# Patient Record
Sex: Female | Born: 1985 | Race: White | Hispanic: Yes | Marital: Married | Smoking: Never smoker
Health system: Southern US, Community
[De-identification: ages and names within clinical notes are randomized; demographics above are authoritative.]

## PROBLEM LIST (undated history)

## (undated) ENCOUNTER — Inpatient Hospital Stay (HOSPITAL_COMMUNITY): Payer: Self-pay

## (undated) DIAGNOSIS — O1490 Unspecified pre-eclampsia, unspecified trimester: Secondary | ICD-10-CM

## (undated) DIAGNOSIS — I8390 Asymptomatic varicose veins of unspecified lower extremity: Secondary | ICD-10-CM

---
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
## 2006-01-04 ENCOUNTER — Emergency Department (HOSPITAL_COMMUNITY): Admission: EM | Admit: 2006-01-04 | Discharge: 2006-01-05 | Payer: Self-pay

## 2006-08-09 ENCOUNTER — Ambulatory Visit (HOSPITAL_COMMUNITY): Admission: RE | Admit: 2006-08-09 | Discharge: 2006-08-09 | Payer: Self-pay | Admitting: Family Medicine

## 2006-09-03 ENCOUNTER — Inpatient Hospital Stay (HOSPITAL_COMMUNITY): Admission: AD | Admit: 2006-09-03 | Discharge: 2006-09-03 | Payer: Self-pay | Admitting: Obstetrics & Gynecology

## 2006-09-03 ENCOUNTER — Ambulatory Visit: Payer: Self-pay | Admitting: Obstetrics and Gynecology

## 2006-09-10 ENCOUNTER — Inpatient Hospital Stay (HOSPITAL_COMMUNITY): Admission: AD | Admit: 2006-09-10 | Discharge: 2006-09-11 | Payer: Self-pay | Admitting: Obstetrics & Gynecology

## 2006-09-14 ENCOUNTER — Inpatient Hospital Stay (HOSPITAL_COMMUNITY): Admission: AD | Admit: 2006-09-14 | Discharge: 2006-09-15 | Payer: Self-pay | Admitting: Family Medicine

## 2006-09-14 ENCOUNTER — Ambulatory Visit: Payer: Self-pay | Admitting: Obstetrics and Gynecology

## 2007-06-23 ENCOUNTER — Ambulatory Visit: Payer: Self-pay | Admitting: Obstetrics and Gynecology

## 2007-06-23 ENCOUNTER — Inpatient Hospital Stay (HOSPITAL_COMMUNITY): Admission: AD | Admit: 2007-06-23 | Discharge: 2007-06-24 | Payer: Self-pay | Admitting: Obstetrics and Gynecology

## 2007-07-11 ENCOUNTER — Ambulatory Visit: Payer: Self-pay | Admitting: Obstetrics & Gynecology

## 2007-07-11 ENCOUNTER — Encounter: Payer: Self-pay | Admitting: Obstetrics & Gynecology

## 2007-07-18 ENCOUNTER — Ambulatory Visit: Payer: Self-pay | Admitting: Obstetrics & Gynecology

## 2007-07-25 ENCOUNTER — Ambulatory Visit: Payer: Self-pay | Admitting: Obstetrics & Gynecology

## 2007-07-26 ENCOUNTER — Ambulatory Visit (HOSPITAL_COMMUNITY): Admission: RE | Admit: 2007-07-26 | Discharge: 2007-07-26 | Payer: Self-pay | Admitting: Family Medicine

## 2007-08-04 ENCOUNTER — Ambulatory Visit: Payer: Self-pay | Admitting: *Deleted

## 2007-08-04 ENCOUNTER — Inpatient Hospital Stay (HOSPITAL_COMMUNITY): Admission: AD | Admit: 2007-08-04 | Discharge: 2007-08-04 | Payer: Self-pay | Admitting: Family Medicine

## 2007-08-08 ENCOUNTER — Ambulatory Visit: Payer: Self-pay | Admitting: Obstetrics & Gynecology

## 2007-08-22 ENCOUNTER — Ambulatory Visit: Payer: Self-pay | Admitting: Obstetrics & Gynecology

## 2007-08-29 ENCOUNTER — Ambulatory Visit: Payer: Self-pay | Admitting: Obstetrics & Gynecology

## 2007-09-05 ENCOUNTER — Inpatient Hospital Stay (HOSPITAL_COMMUNITY): Admission: AD | Admit: 2007-09-05 | Discharge: 2007-09-05 | Payer: Self-pay | Admitting: Obstetrics and Gynecology

## 2007-09-05 ENCOUNTER — Ambulatory Visit: Payer: Self-pay | Admitting: Obstetrics & Gynecology

## 2007-09-10 ENCOUNTER — Ambulatory Visit: Payer: Self-pay | Admitting: Obstetrics and Gynecology

## 2007-09-10 ENCOUNTER — Inpatient Hospital Stay (HOSPITAL_COMMUNITY): Admission: AD | Admit: 2007-09-10 | Discharge: 2007-09-13 | Payer: Self-pay | Admitting: Obstetrics & Gynecology

## 2009-08-03 ENCOUNTER — Emergency Department (HOSPITAL_COMMUNITY): Admission: EM | Admit: 2009-08-03 | Discharge: 2009-08-03 | Payer: Self-pay | Admitting: Emergency Medicine

## 2010-08-22 LAB — POCT I-STAT, CHEM 8
Calcium, Ion: 1.22 mmol/L (ref 1.12–1.32)
Chloride: 105 mEq/L (ref 96–112)
Chloride: 105 mEq/L (ref 96–112)
Creatinine, Ser: 0.5 mg/dL (ref 0.4–1.2)
Creatinine, Ser: 0.7 mg/dL (ref 0.4–1.2)
Glucose, Bld: 76 mg/dL (ref 70–99)
HCT: 39 % (ref 36.0–46.0)
Hemoglobin: 13.3 g/dL (ref 12.0–15.0)
Potassium: 3.9 mEq/L (ref 3.5–5.1)
TCO2: 28 mmol/L (ref 0–100)
TCO2: 29 mmol/L (ref 0–100)

## 2010-08-22 LAB — URINALYSIS, ROUTINE W REFLEX MICROSCOPIC
Bilirubin Urine: NEGATIVE
Hgb urine dipstick: NEGATIVE
Ketones, ur: NEGATIVE mg/dL
Nitrite: NEGATIVE
Protein, ur: NEGATIVE mg/dL

## 2010-10-12 NOTE — Discharge Summary (Signed)
Debra Newton, Debra Newton    ACCOUNT NO.:  [id number]   MEDICAL RECORD NO.:  [id number]          PATIENT TYPE:  INP   LOCATION:  9140                          [facility]   PHYSICIAN:  Phil D. Okey Dupre, M.D.     DATE OF BIRTH:  02-Apr-1986   DATE OF ADMISSION:  09/10/2007  DATE OF DISCHARGE:  09/13/2007                               DISCHARGE SUMMARY   DISCHARGE DIAGNOSES:  1. Spontaneous vaginal delivery at term.  2. Preeclampsia.   DISCHARGE MEDICATIONS:  1. Ibuprofen 600 mg p.o. q.6 hours p.r.n. pain.  2. Colace 100 mg p.o. daily p.r.n. constipation.  3. Prenatal vitamins 1 tab p.o. daily.   BRIEF HOSPITAL COURSE:  This is a 25 year old G3, P3-0-0-3 who had a  term delivery at 40 weeks and 5 days' gestation.  She had elevated blood  pressures during this admission as well as headache, and given her  history of preeclampsia with her last pregnancy, she was placed on  magnesium sulfate IV during labor and for 24 hours postpartum.  Preeclampsia labs were drawn and were within normal limits.  At the time  of discharge, the patient has blood pressure of 122/77, previous blood  pressure was 115/62.  She has been off magnesium since September 12, 2007,  and is discharged home on September 13, 2007.  At the time of discharge, she  has normal deep tendon reflexes.  She is complaining of a mild headache,  but has been Tylenol for this.  She delivered a 7-pound 6-ounce female  infant after spontaneous rupture of membranes and a normal delivery with  no complications and an intact perineum.  The patient is GBS positive,  ABO type O, Rh positive,  AB negative,  Rubella immune,  HBS antigen  negative,  RPR nonreactive,  GC negative,  Chlamydia negative,  HIV  nonreactive.  She has elected to breast-feed the infant and is doing  well at this time.  She has opted for Depo-Provera injection prior to  discharge and will have an IUD placed at her 6-week followup.  The  patient was discharged to home  in stable medical condition and is given  routine postpartum instructions and instructed to followup at the  [facility] in 6 weeks.  She was also instructed  to return to clinic if she has a persistent headache or changes in her  vision.      Romero Belling, MD      Phil D. Okey Dupre, M.D.  Electronically Signed    MO/MEDQ  D:  09/13/2007  T:  09/13/2007  Job:  [id number]

## 2010-10-15 NOTE — H&P (Signed)
Debra Newton, Debra Newton            ACCOUNT NO.:  [id number]   MEDICAL RECORD NO.:  [id number]          PATIENT TYPE:  INP   LOCATION:  9160                          [facility]   PHYSICIAN:  Roseanna Rainbow, M.D.DATE OF BIRTH:  1986/05/08   DATE OF ADMISSION:  08/16/2004  DATE OF DISCHARGE:                                HISTORY & PHYSICAL   CHIEF COMPLAINT:  The patient is an 25 year old, para 0, with an estimated  date of confinement of August 15, 2004 with an intrauterine pregnancy at 1  and 1/7ths weeks, with elevated blood pressures.  The patient presented to  the office today for a routine prenatal visit.  Blood pressures were  140s/90s.  She denied any concomitant symptoms.   ANTEPARTUM PROBLEMS AND RISKS:  1.  Adolescent.  2.  Ultrasound on April 21, 2004 revealed anterior placenta, normal      amniotic fluid volume.  Estimated fetal weight percentile - 46      percentile for 23 weeks and 3 days.   PRENATAL SCREENS:  Antibody screen negative.  Blood type O positive.  Chlamydia negative.  One hour GCT 91.  GBS negative.  GC negative.  Hepatitis B surface antigen negative.  Hemoglobin 12.7.  Pap smear within  normal limits.  Platelets 198,000.  Urine culture and sensitivity negative.   Fetal heart tracing reassuring.  Tocodynamometer revealed irregular uterine  contractions.   PAST OBSTETRIC AND GYNECOLOGIC HISTORY:  She denies.   PAST MEDICAL HISTORY:  She denies.   PAST SURGICAL HISTORY:  She denies.   SOCIAL HISTORY:  She denies any tobacco, ethanol, or substance abuse.   ALLERGIES:  No known drug allergies.   MEDICATIONS:  1.  Zantac.  2.  Prenatal vitamins.   PHYSICAL EXAMINATION:  VITAL SIGNS:  Temperature 98, pulse 96, respirations  20, blood pressure 139/82.  GENERAL:  Well-developed, well-nourished, in no apparent distress.  ABDOMEN:  Nontender.  Presentation cephalic by Leopold's.  PELVIC:  Sterile vaginal exam revealed the cervix is  posterior,  approximately 50%, fingertip to 1, with the vertex at a -2 station.   ASSESSMENT:  Primigravida at 40 plus weeks with rule out pregnancy-induced  hypertension.  Fetal heart tracing consistent with fetal well being.   PLAN:  1.  Admission.  2.  Further ripening with Cytotec.  3.  PIH panel.      LAJ/MEDQ  D:  08/16/2004  T:  08/16/2004  Job:  [id number]

## 2010-10-15 NOTE — Discharge Summary (Signed)
Debra Newton, Debra Newton                        ACCOUNT NO.:  [id number]   MEDICAL RECORD NO.:  [id number]                   PATIENT TYPE:  INP   LOCATION:  9311                                 [facility]   PHYSICIAN:  Charles A. Clearance Coots, M.D.             DATE OF BIRTH:  [birth year]   DATE OF ADMISSION:  02/14/2004  DATE OF DISCHARGE:                                 DISCHARGE SUMMARY   ADMITTING DIAGNOSES:  1.  Approximately [redacted] weeks gestation.  2.  Hyperemesis.   DISCHARGE DIAGNOSES:  1.  Approximately [redacted] weeks gestation.  2.  Hyperemesis.  3.  Improved after intravenous hydration and supportive management.      Discharged home in good condition undelivered at [redacted] weeks gestation.   REASON FOR ADMISSION:  A 25 year old Hispanic female G1 with estimated date  of confinement at August 13, 2004 presents with nausea and vomiting all day  on the day of admission.  Also complained of back pain and lower abdominal  pain.  She denied vaginal bleeding or dysuria, fever or chills.   PAST MEDICAL HISTORY:  Surgery:  None.  Illnesses:  None.   MEDICATIONS:  Tylenol.   ALLERGIES:  No known drug allergies.   SOCIAL HISTORY:  Single.  Negative tobacco, alcohol, or recreational drug  use.   PHYSICAL EXAMINATION:  GENERAL:  Well-nourished, well-developed Hispanic  female in no acute distress.  VITAL SIGNS:  Temperature 97.8, pulse 90, respiratory rate 20, blood  pressure 137/75.  LUNGS:  Clear to auscultation bilaterally.  HEART:  Regular rate and rhythm.  ABDOMEN:  Gravid, nontender.  PELVIC:  Cervix was long and closed. Uterus was about 12-14 weeks size,  nontender.   IMPRESSION:  Fourteen weeks gestation, hyperemesis.   PLAN:  Admit, supportive management.   ADMITTING LABORATORY VALUES:  Urinalysis revealed specific gravity of 1.020,  15 ketones, negative nitrite, negative leukocyte esterase.   HOSPITAL COURSE:  The patient was admitted and started on IV hydration and  IV  Phenergan for nausea.  She responded well to therapy and by hospital day  #2 was feeling better.  Discharged home on hospital day #3 much improved.   DISCHARGE DISPOSITION:  1.  Medications:  Zantac 150 mg p.o. twice a day.  Stop prenatal vitamins      for now until later in pregnancy.  2.  Routine written instructions per booklet were given for undelivered      obstetrical discharge.  3.  The patient has an appointment to follow up in the office.      CAH/MEDQ  D:  02/17/2004  T:  02/17/2004  Job:  [id number]

## 2010-11-29 ENCOUNTER — Emergency Department (HOSPITAL_COMMUNITY)
Admission: EM | Admit: 2010-11-29 | Discharge: 2010-11-30 | Disposition: A | Discharge status: Home / Self Care | Payer: Self-pay | Attending: Emergency Medicine | Admitting: Emergency Medicine

## 2010-11-29 ENCOUNTER — Emergency Department (HOSPITAL_COMMUNITY): Payer: Self-pay

## 2010-11-29 DIAGNOSIS — R1011 Right upper quadrant pain: Secondary | ICD-10-CM | POA: Insufficient documentation

## 2010-11-29 DIAGNOSIS — N898 Other specified noninflammatory disorders of vagina: Secondary | ICD-10-CM | POA: Insufficient documentation

## 2010-11-29 DIAGNOSIS — R10811 Right upper quadrant abdominal tenderness: Secondary | ICD-10-CM | POA: Insufficient documentation

## 2010-11-29 DIAGNOSIS — K219 Gastro-esophageal reflux disease without esophagitis: Secondary | ICD-10-CM | POA: Insufficient documentation

## 2010-11-29 LAB — HEPATIC FUNCTION PANEL
Indirect Bilirubin: 0.6 mg/dL (ref 0.3–0.9)
Total Bilirubin: 0.7 mg/dL (ref 0.3–1.2)

## 2010-11-29 LAB — DIFFERENTIAL
Eosinophils Absolute: 0.4 10*3/uL (ref 0.0–0.7)
Eosinophils Relative: 5 % (ref 0–5)
Lymphocytes Relative: 41 % (ref 12–46)
Lymphs Abs: 3.3 10*3/uL (ref 0.7–4.0)
Monocytes Absolute: 0.6 10*3/uL (ref 0.1–1.0)
Neutro Abs: 3.6 10*3/uL (ref 1.7–7.7)
Neutrophils Relative %: 45 % (ref 43–77)

## 2010-11-29 LAB — BASIC METABOLIC PANEL
BUN: 15 mg/dL (ref 6–23)
Calcium: 9 mg/dL (ref 8.4–10.5)
GFR calc Af Amer: >60 mL/min (ref >60)
Sodium: 140 mEq/L (ref 135–145)

## 2010-11-29 LAB — LIPASE, BLOOD: Lipase: 26 U/L (ref 11–59)

## 2010-11-29 LAB — URINE MICROSCOPIC-ADD ON

## 2010-11-29 LAB — CBC
MCH: 32.5 pg (ref 26.0–34.0)
MCHC: 35.6 g/dL (ref 30.0–36.0)
MCV: 91.5 fL (ref 78.0–100.0)
Platelets: 208 10*3/uL (ref 150–400)
RBC: 3.75 MIL/uL — ABNORMAL LOW (ref 3.87–5.11)

## 2010-11-29 LAB — URINALYSIS, ROUTINE W REFLEX MICROSCOPIC: Nitrite: NEGATIVE

## 2010-11-29 LAB — WET PREP, GENITAL
WBC, Wet Prep HPF POC: NONE SEEN
Yeast Wet Prep HPF POC: NONE SEEN

## 2010-11-30 LAB — GC/CHLAMYDIA PROBE AMP, GENITAL
Chlamydia, DNA Probe: NEGATIVE
GC Probe Amp, Genital: NEGATIVE

## 2011-01-10 ENCOUNTER — Emergency Department (HOSPITAL_COMMUNITY): Payer: Self-pay

## 2011-01-10 ENCOUNTER — Emergency Department (HOSPITAL_COMMUNITY)
Admission: EM | Admit: 2011-01-10 | Discharge: 2011-01-10 | Disposition: A | Discharge status: Home / Self Care | Payer: Self-pay | Attending: Emergency Medicine | Admitting: Emergency Medicine

## 2011-01-10 DIAGNOSIS — Y92009 Unspecified place in unspecified non-institutional (private) residence as the place of occurrence of the external cause: Secondary | ICD-10-CM | POA: Insufficient documentation

## 2011-01-10 DIAGNOSIS — M79609 Pain in unspecified limb: Secondary | ICD-10-CM | POA: Insufficient documentation

## 2011-01-10 DIAGNOSIS — R51 Headache: Secondary | ICD-10-CM | POA: Insufficient documentation

## 2011-01-10 DIAGNOSIS — R079 Chest pain, unspecified: Secondary | ICD-10-CM | POA: Insufficient documentation

## 2011-01-10 DIAGNOSIS — W108XXA Fall (on) (from) other stairs and steps, initial encounter: Secondary | ICD-10-CM | POA: Insufficient documentation

## 2011-01-10 DIAGNOSIS — S0990XA Unspecified injury of head, initial encounter: Secondary | ICD-10-CM | POA: Insufficient documentation

## 2011-01-10 DIAGNOSIS — M542 Cervicalgia: Secondary | ICD-10-CM | POA: Insufficient documentation

## 2011-01-10 DIAGNOSIS — K219 Gastro-esophageal reflux disease without esophagitis: Secondary | ICD-10-CM | POA: Insufficient documentation

## 2011-01-10 DIAGNOSIS — T07XXXA Unspecified multiple injuries, initial encounter: Secondary | ICD-10-CM | POA: Insufficient documentation

## 2011-01-10 DIAGNOSIS — M25559 Pain in unspecified hip: Secondary | ICD-10-CM | POA: Insufficient documentation

## 2011-01-10 LAB — URINALYSIS, ROUTINE W REFLEX MICROSCOPIC
Glucose, UA: NEGATIVE mg/dL
Hgb urine dipstick: NEGATIVE
Leukocytes, UA: NEGATIVE
Specific Gravity, Urine: 1.015 (ref 1.005–1.030)
Urobilinogen, UA: 0.2 mg/dL (ref 0.0–1.0)

## 2011-01-10 LAB — POCT I-STAT, CHEM 8
BUN: 13 mg/dL (ref 6–23)
Calcium, Ion: 1.23 mmol/L (ref 1.12–1.32)
Chloride: 105 mEq/L (ref 96–112)
Glucose, Bld: 98 mg/dL (ref 70–99)
Potassium: 3.7 mEq/L (ref 3.5–5.1)

## 2011-01-10 LAB — SAMPLE TO BLOOD BANK

## 2011-01-10 MED ORDER — IOHEXOL 300 MG/ML  SOLN
100.0000 mL | Freq: Once | INTRAMUSCULAR | Status: AC | PRN
  Administered 2011-01-10: 100 mL via INTRAVENOUS

## 2011-02-17 LAB — CBC
HCT: 35.2 — ABNORMAL LOW
Hemoglobin: 12.1
MCHC: 34.4
MCV: 89.4
RBC: 3.94
WBC: 9.9

## 2011-02-17 LAB — ABO/RH: ABO/RH(D): O POS

## 2011-02-17 LAB — DIFFERENTIAL
Band Neutrophils: 0
Basophils Relative: 0
Eosinophils Relative: 4
Metamyelocytes Relative: 0
Myelocytes: 0
Promyelocytes Absolute: 0

## 2011-02-17 LAB — URINALYSIS, ROUTINE W REFLEX MICROSCOPIC
Glucose, UA: NEGATIVE
Ketones, ur: NEGATIVE
Nitrite: NEGATIVE
Protein, ur: NEGATIVE
Urobilinogen, UA: 0.2

## 2011-02-17 LAB — HEPATITIS B SURFACE ANTIGEN: Hepatitis B Surface Ag: NEGATIVE

## 2011-02-17 LAB — RAPID URINE DRUG SCREEN, HOSP PERFORMED
Barbiturates: NOT DETECTED
Benzodiazepines: NOT DETECTED

## 2011-02-17 LAB — TYPE AND SCREEN
ABO/RH(D): O POS
Antibody Screen: NEGATIVE

## 2011-02-17 LAB — RUBELLA SCREEN: Rubella: 38.4 — ABNORMAL HIGH

## 2011-02-18 LAB — POCT URINALYSIS DIP (DEVICE)
Ketones, ur: NEGATIVE
Ketones, ur: NEGATIVE
Operator id: 148111
Operator id: 159681
Protein, ur: NEGATIVE
Protein, ur: 30 — AB
Protein, ur: NEGATIVE
Specific Gravity, Urine: 1.015
Specific Gravity, Urine: >=1.03
Urobilinogen, UA: 0.2
Urobilinogen, UA: 0.2
Urobilinogen, UA: 0.2
pH: 6
pH: 5.5
pH: 5.5

## 2011-02-21 LAB — POCT URINALYSIS DIP (DEVICE)
Glucose, UA: NEGATIVE
Glucose, UA: NEGATIVE
Hgb urine dipstick: NEGATIVE
Protein, ur: NEGATIVE
Specific Gravity, Urine: <=1.005
Specific Gravity, Urine: 1.02
Urobilinogen, UA: 0.2
Urobilinogen, UA: 0.2
pH: 6
pH: 5.5

## 2011-02-21 LAB — URINALYSIS, ROUTINE W REFLEX MICROSCOPIC
Bilirubin Urine: NEGATIVE
Glucose, UA: NEGATIVE
Hgb urine dipstick: NEGATIVE
Ketones, ur: NEGATIVE
Protein, ur: NEGATIVE
pH: 5.5

## 2011-02-21 LAB — URINE CULTURE

## 2011-02-21 LAB — URINE MICROSCOPIC-ADD ON: RBC / HPF: NONE SEEN

## 2011-02-22 LAB — POCT URINALYSIS DIP (DEVICE)
Bilirubin Urine: NEGATIVE
Ketones, ur: NEGATIVE
Ketones, ur: NEGATIVE
Nitrite: NEGATIVE
Nitrite: NEGATIVE
Operator id: 159681
Operator id: 297281
Protein, ur: 30 — AB
Protein, ur: NEGATIVE
pH: 5.5
pH: 5.5

## 2011-02-22 LAB — CBC
HCT: 33 — ABNORMAL LOW
HCT: 33.7 — ABNORMAL LOW
Hemoglobin: 11 — ABNORMAL LOW
MCHC: 34
MCHC: 34
MCV: 87
Platelets: 164
Platelets: 187
RBC: 3.74 — ABNORMAL LOW
RBC: 3.87
RDW: 14.7
WBC: 11.5 — ABNORMAL HIGH
WBC: 12 — ABNORMAL HIGH
WBC: 9.4

## 2011-02-22 LAB — DIFFERENTIAL
Basophils Relative: 0
Eosinophils Relative: 1
Lymphocytes Relative: 30
Monocytes Absolute: 1
Monocytes Relative: 8
Neutrophils Relative %: 61

## 2011-02-22 LAB — COMPREHENSIVE METABOLIC PANEL
AST: 27
BUN: 11
CO2: 22
Calcium: 9.4
Chloride: 106
Creatinine, Ser: 0.85
GFR calc Af Amer: >60
GFR calc non Af Amer: >60
Total Bilirubin: 0.7

## 2011-02-22 LAB — URIC ACID: Uric Acid, Serum: 7

## 2011-02-22 LAB — RPR: RPR Ser Ql: NONREACTIVE

## 2011-04-28 ENCOUNTER — Encounter: Payer: Self-pay | Admitting: Emergency Medicine

## 2011-04-28 ENCOUNTER — Emergency Department (HOSPITAL_COMMUNITY)
Admission: EM | Admit: 2011-04-28 | Discharge: 2011-04-29 | Disposition: A | Discharge status: Home / Self Care | Payer: Self-pay | Attending: Emergency Medicine | Admitting: Emergency Medicine

## 2011-04-28 DIAGNOSIS — J111 Influenza due to unidentified influenza virus with other respiratory manifestations: Secondary | ICD-10-CM | POA: Insufficient documentation

## 2011-04-28 NOTE — ED Notes (Signed)
Pt states she has been exposed to the flu by members of her family  Pt states she has had a fever, body aches, chills, sore throat, headache, and abd pain   Pt states sxs started today  Pt states her lips hurts as well  Pt states her fever earlier was 102.7 orally about 3 hrs ago  Temp 98.6 orally in triage  Pt states she has taken some pills from [place] called XL3 for her sxs

## 2011-04-29 MED ORDER — OSELTAMIVIR PHOSPHATE 75 MG PO CAPS: 75.0000 mg | ORAL_CAPSULE | Freq: Two times a day (BID) | ORAL | Status: AC

## 2011-04-29 MED ORDER — IBUPROFEN 800 MG PO TABS: 800.0000 mg | ORAL_TABLET | Freq: Three times a day (TID) | ORAL | Status: AC

## 2011-04-29 MED ORDER — ACETAMINOPHEN 325 MG PO TABS
650.0000 mg | ORAL_TABLET | Freq: Once | ORAL | Status: AC
  Administered 2011-04-29: 650 mg via ORAL
  Filled 2011-04-29: qty 2

## 2011-04-29 NOTE — ED Provider Notes (Signed)
Medical screening examination/treatment/procedure(s) were performed by non-physician practitioner and as supervising physician I was immediately available for consultation/collaboration.  Mariska Daffin M Shakesha Soltau, MD 04/29/11 0743 

## 2011-04-29 NOTE — ED Provider Notes (Signed)
History     CSN: [id number] Arrival date & time: 04/28/2011  9:23 PM   First MD Initiated Contact with Patient 04/28/11 2200      Chief Complaint  Patient presents with  . Influenza    (Consider location/radiation/quality/duration/timing/severity/associated sxs/prior treatment) The history is provided by the patient.  25 y/o F presents with sudden onset in last 24 hours of congestion, sneezing, HA, fever up to 102.7, myalgias, cough, nausea. Reports that multiple family members have recently been evaluated by medical provider and dx with influenza. Has tried a [place] medication called XL3 with no symptom relief. Nothing specific makes the symptoms better or worse. They are gradually worsening and are constant.  History reviewed. No pertinent past medical history.  History reviewed. No pertinent past surgical history.  Family History  Problem Relation Age of Onset  . Diabetes Other   . Hypertension Other     History  Substance Use Topics  . Smoking status: Never Smoker   . Smokeless tobacco: Not on file  . Alcohol Use: No     Review of Systems  Constitutional: Positive for fever, chills and fatigue.  HENT: Positive for congestion, sore throat, rhinorrhea and sneezing. Negative for ear pain, nosebleeds, neck pain and neck stiffness.   Eyes: Negative for pain and visual disturbance.  Respiratory: Positive for cough. Negative for choking, chest tightness, shortness of breath and wheezing.   Cardiovascular: Negative for chest pain and palpitations.  Gastrointestinal: Positive for nausea. Negative for vomiting, abdominal pain and diarrhea.  Genitourinary: Negative for dysuria, hematuria and flank pain.  Musculoskeletal: Positive for myalgias. Negative for back pain and gait problem.  Skin: Negative for rash and wound.  Neurological: Positive for headaches. Negative for dizziness, syncope, weakness, light-headedness and numbness.  Hematological: Does not bruise/bleed easily.    Psychiatric/Behavioral: Negative for behavioral problems and confusion.    Allergies  Review of patient's allergies indicates no known allergies.  Home Medications   Current Outpatient Rx  Name Route Sig Dispense Refill  . OVER THE COUNTER MEDICATION Oral Take 2 tablets by mouth every 6 (six) hours as needed. Cold, congestion, runny nose and chills....says that it is a [place] pill called xl3       BP 104/53  Pulse 99  Temp(Src) 97.8 F (36.6 C) (Oral)  Resp 14  Ht 5\' 4"  (1.626 m)  Wt 149 lb 9 oz (67.841 kg)  BMI 25.67 kg/m2  SpO2 100%  LMP 03/30/2011  Physical Exam  Nursing note and vitals reviewed. Constitutional: She is oriented to person, place, and time. She appears well-developed and well-nourished.       Uncomfortable-appearing  HENT:  Head: Normocephalic and atraumatic.  Right Ear: Tympanic membrane and external ear normal.  Left Ear: Tympanic membrane normal.  Mouth/Throat: Mucous membranes are normal. Posterior oropharyngeal erythema present. No oropharyngeal exudate or tonsillar abscesses.  Eyes: EOM are normal. Pupils are equal, round, and reactive to light. Right eye exhibits no discharge. Left eye exhibits no discharge.  Neck: Normal range of motion. Neck supple.  Cardiovascular: Normal rate, regular rhythm, normal heart sounds and intact distal pulses.   No murmur heard. Pulmonary/Chest: Effort normal and breath sounds normal. No respiratory distress. She has no wheezes. She exhibits no tenderness.  Abdominal: Soft. Bowel sounds are normal. She exhibits no distension. There is no tenderness.  Musculoskeletal: Normal range of motion. She exhibits no edema and no tenderness.  Lymphadenopathy:    She has no cervical adenopathy.  Neurological: She is alert and  oriented to person, place, and time. No cranial nerve deficit. Coordination normal.  Skin: Skin is warm and dry. No rash noted.  Psychiatric: She has a normal mood and affect. Her behavior is normal.     ED Course  Procedures (including critical care time)  Labs Reviewed - No data to display No results found.   1. Influenza       MDM  Influenza symptoms of 24h duration with flu dx in family. Pt with stable VS. Will tx with tamiflu and advise supportive tx measures otherwise.        [address] [place], [place] 04/29/11 [phone]

## 2011-05-31 NOTE — L&D Delivery Note (Signed)
Delivery Note At 1:43 AM a viable female was delivered via Vaginal, Spontaneous Delivery (Presentation: ; Occiput Anterior).  APGAR: 9, 9; weight 7 lb 1.8 oz (3225 g).   Placenta status: Intact, Spontaneous.  Cord: 3 vessels with the following complications: None.  Cord pH: NA  Anesthesia: None  Episiotomy: None Lacerations: None Suture Repair: NA Est. Blood Loss (mL): 200  Mom to postpartum.  Baby to nursery-stable. Placenta to: BS Feeding: Breast Circ: NA Contraception: undecided  Delivery performed by Ginger Organ, MD under my direct supervision.  Dorathy Kinsman 04/30/2012, 3:34 AM

## 2011-12-20 ENCOUNTER — Other Ambulatory Visit (HOSPITAL_COMMUNITY): Payer: Self-pay | Admitting: Physician Assistant

## 2011-12-20 DIAGNOSIS — Z3689 Encounter for other specified antenatal screening: Secondary | ICD-10-CM

## 2011-12-26 ENCOUNTER — Ambulatory Visit (HOSPITAL_COMMUNITY)
Admission: RE | Admit: 2011-12-26 | Discharge: 2011-12-26 | Disposition: A | Discharge status: Home / Self Care | Payer: Self-pay | Source: Ambulatory Visit | Attending: Physician Assistant | Admitting: Physician Assistant

## 2011-12-26 DIAGNOSIS — Z3689 Encounter for other specified antenatal screening: Secondary | ICD-10-CM

## 2011-12-26 DIAGNOSIS — O09299 Supervision of pregnancy with other poor reproductive or obstetric history, unspecified trimester: Secondary | ICD-10-CM | POA: Insufficient documentation

## 2011-12-26 DIAGNOSIS — Z1389 Encounter for screening for other disorder: Secondary | ICD-10-CM | POA: Insufficient documentation

## 2011-12-26 DIAGNOSIS — O358XX0 Maternal care for other (suspected) fetal abnormality and damage, not applicable or unspecified: Secondary | ICD-10-CM | POA: Insufficient documentation

## 2011-12-26 DIAGNOSIS — Z363 Encounter for antenatal screening for malformations: Secondary | ICD-10-CM | POA: Insufficient documentation

## 2012-01-30 ENCOUNTER — Inpatient Hospital Stay (HOSPITAL_COMMUNITY)
Admission: AD | Admit: 2012-01-30 | Discharge: 2012-01-30 | Disposition: A | Discharge status: Home / Self Care | Payer: Self-pay | Source: Ambulatory Visit | Attending: Obstetrics & Gynecology | Admitting: Obstetrics & Gynecology

## 2012-01-30 ENCOUNTER — Encounter (HOSPITAL_COMMUNITY): Payer: Self-pay

## 2012-01-30 DIAGNOSIS — A499 Bacterial infection, unspecified: Secondary | ICD-10-CM

## 2012-01-30 DIAGNOSIS — N949 Unspecified condition associated with female genital organs and menstrual cycle: Secondary | ICD-10-CM | POA: Insufficient documentation

## 2012-01-30 DIAGNOSIS — O2390 Unspecified genitourinary tract infection in pregnancy, unspecified trimester: Secondary | ICD-10-CM | POA: Insufficient documentation

## 2012-01-30 DIAGNOSIS — B9689 Other specified bacterial agents as the cause of diseases classified elsewhere: Secondary | ICD-10-CM | POA: Insufficient documentation

## 2012-01-30 DIAGNOSIS — N760 Acute vaginitis: Secondary | ICD-10-CM | POA: Insufficient documentation

## 2012-01-30 DIAGNOSIS — R109 Unspecified abdominal pain: Secondary | ICD-10-CM | POA: Insufficient documentation

## 2012-01-30 HISTORY — DX: Unspecified pre-eclampsia, unspecified trimester: O14.90

## 2012-01-30 HISTORY — DX: Asymptomatic varicose veins of unspecified lower extremity: I83.90

## 2012-01-30 LAB — URINE MICROSCOPIC-ADD ON

## 2012-01-30 LAB — WET PREP, GENITAL
Clue Cells Wet Prep HPF POC: NONE SEEN
Trich, Wet Prep: NONE SEEN

## 2012-01-30 LAB — URINALYSIS, ROUTINE W REFLEX MICROSCOPIC
Bilirubin Urine: NEGATIVE
Glucose, UA: NEGATIVE mg/dL
Ketones, ur: NEGATIVE mg/dL
pH: 7 (ref 5.0–8.0)

## 2012-01-30 MED ORDER — METRONIDAZOLE 500 MG PO TABS: 500.0000 mg | ORAL_TABLET | Freq: Two times a day (BID) | ORAL | Status: AC

## 2012-01-30 NOTE — ED Provider Notes (Signed)
First Provider Initiated Contact with Patient 01/30/12 1238      Chief Complaint:  Abdominal Pain and Vaginal Pain   Debra Newton is  26 y.o. [phone] at [redacted]w[redacted]d presents complaining of Abdominal Pain and Vaginal Pain . She states that she has vaginal pressure "like the baby is coming out" with vaginal burning and increased discharge. She states noneShe contractions are associated with none vaginal bleeding, intact membranes, along with active fetal movement.   Obstetrical/Gynecological History: Menstrual History: OB History    Grav Para Term Preterm Abortions TAB SAB Ect Mult Living   4 3 3       3       Patient's last menstrual period was 07/05/2011.     Past Medical History: Past Medical History  Diagnosis Date  . Preeclampsia   . Varicose veins     Past Surgical History: Past Surgical History  Procedure Date  . No past surgeries     Family History: Family History  Problem Relation Age of Onset  . Diabetes Other   . Hypertension Other     Social History: History  Substance Use Topics  . Smoking status: Never Smoker   . Smokeless tobacco: Not on file  . Alcohol Use: No    Allergies: No Known Allergies  Meds:  Prescriptions prior to admission  Medication Sig Dispense Refill  . Prenatal Vit-Fe Fumarate-FA (PRENATAL MULTIVITAMIN) TABS Take 1 tablet by mouth every evening.      . ranitidine (ZANTAC) 150 MG tablet Take 150 mg by mouth daily as needed. For acid reflux        Review of Systems - Please refer to the aforementioned patients' reports.     Physical Exam  Blood pressure 118/66, pulse 81, temperature 98.5 F (36.9 C), temperature source Oral, resp. rate 16, height 5' 2.25" (1.581 m), weight 77.293 kg (170 lb 6.4 oz), last menstrual period 07/05/2011, SpO2 100.00%. GENERAL: Well-developed, well-nourished female in no acute distress.  LUNGS: Clear to auscultation bilaterally.  HEART: Regular rate and rhythm. ABDOMEN: Soft, nontender,  nondistended, gravid.  EXTREMITIES: Nontender, no edema, 2+ distal pulses. CERVICAL EXAM: Dilatation 0cm   Effacement 0%   Station -3, Firm  ;  Vagina with red sidewalls, thin frothy discharge with amine odor; Presentation: unsure, out of pelvis FHT:  Baseline rate 140 bpm   Variability moderate  Accelerations present   Decelerations none Contractions: Every 0 mins   Labs: U/A negative except trace hgb; fFn negative;  Wet prep negative clue cells  Imaging Studies:  No results found.  Assessment: Debra Newton is  26 y.o. [phone] at [redacted]w[redacted]d presents with no evidence of PTL; BV based on symptoms.  Plan: Treat BV; maternity belt  CRESENZO-DISHMAN,Keyton Bhat 9/2/201312:47 PM

## 2012-01-30 NOTE — MAU Note (Signed)
Patient states she started having vaginal and abdominal pain and pressure this am. Denies any leaking, bleeding and reports fetal movement.

## 2012-03-27 ENCOUNTER — Inpatient Hospital Stay (HOSPITAL_COMMUNITY)
Admission: AD | Admit: 2012-03-27 | Discharge: 2012-03-28 | Disposition: A | Discharge status: Home / Self Care | Payer: Self-pay | Source: Ambulatory Visit | Attending: Obstetrics & Gynecology | Admitting: Obstetrics & Gynecology

## 2012-03-27 ENCOUNTER — Encounter (HOSPITAL_COMMUNITY): Payer: Self-pay | Admitting: *Deleted

## 2012-03-27 DIAGNOSIS — O4700 False labor before 37 completed weeks of gestation, unspecified trimester: Secondary | ICD-10-CM

## 2012-03-27 DIAGNOSIS — O99891 Other specified diseases and conditions complicating pregnancy: Secondary | ICD-10-CM | POA: Insufficient documentation

## 2012-03-27 NOTE — MAU Note (Signed)
Pt states she feels like her water might have broken about 2230, after getting of the shower pt states while doing her hair she noticed fluid

## 2012-03-28 ENCOUNTER — Encounter (HOSPITAL_COMMUNITY): Payer: Self-pay | Admitting: Family Medicine

## 2012-03-28 NOTE — MAU Provider Note (Signed)
  History     CSN: [id number]  Arrival date and time: 03/27/12 2244 Debra Newton 26 y.o. [id number] [redacted]w[redacted]d   Chief Complaint  Patient presents with  . Rupture of Membranes   HPI Patient presented to the MAU this evening with possible ROM. She reports a small gush of fluid after her shower. She denies vaginal bleeding and she has felt her baby move. No complications with this pregnancy and she is in no pain. Her other pregnancies went to 37 weeks or longer.    Past Medical History  Diagnosis Date  . Preeclampsia   . Varicose veins     Past Surgical History  Procedure Date  . No past surgeries     Family History  Problem Relation Age of Onset  . Diabetes Other   . Hypertension Other     History  Substance Use Topics  . Smoking status: Never Smoker   . Smokeless tobacco: Not on file  . Alcohol Use: No    Allergies: No Known Allergies  Prescriptions prior to admission  Medication Sig Dispense Refill  . Prenatal Vit-Fe Fumarate-FA (PRENATAL MULTIVITAMIN) TABS Take 1 tablet by mouth every evening.      . ranitidine (ZANTAC) 150 MG tablet Take 150 mg by mouth daily as needed. For acid reflux        Review of Systems  Eyes: Negative for blurred vision and double vision.  Respiratory: Negative for shortness of breath.   Cardiovascular: Negative for chest pain.  Gastrointestinal: Negative for nausea, vomiting and diarrhea.  Genitourinary: Negative for dysuria and urgency.  Musculoskeletal: Negative for back pain.  Neurological: Negative for dizziness and headaches.  All other systems reviewed and are negative.    Physical Exam   Blood pressure 109/59, pulse 95, temperature 98.5 F (36.9 C), temperature source Oral, resp. rate 18, last menstrual period 07/05/2011.  Physical Exam  Constitutional: She appears well-developed and well-nourished. No distress.  Cardiovascular: Normal rate, regular rhythm and normal heart sounds.   No murmur heard. Respiratory:  Effort normal and breath sounds normal. No respiratory distress. She has no wheezes. She has no rales.  GI: Soft. Bowel sounds are normal. She exhibits no distension and no mass. There is no tenderness. There is no rebound and no guarding.       Gravid   Genitourinary: Vagina normal and uterus normal.       Speculum: Small amount of creamy white vaginal discharge. No pooling or fluid noted.   Skin: She is not diaphoretic.   EFM: FHR:135 , CAT 1 tracing, moderate variability, accelerations present, deceleration absent MAU Course  Procedures 1.EFM 2. FERN negative  Assessment and Plan  1. IUP @ 35.0 wks 2. Membranes intact 3. Discharge home with labor precautions.   Kuneff, Renee 03/28/2012, 12:25 AM   I agree with the above. Valoria Tamburri 1:24 AM 03/28/2012

## 2012-04-29 ENCOUNTER — Inpatient Hospital Stay (HOSPITAL_COMMUNITY)
Admission: AD | Admit: 2012-04-29 | Discharge: 2012-05-02 | DRG: 775 | Disposition: A | Discharge status: Home / Self Care | Payer: Medicaid Other | Source: Ambulatory Visit | Attending: Obstetrics & Gynecology | Admitting: Obstetrics & Gynecology

## 2012-04-29 ENCOUNTER — Encounter (HOSPITAL_COMMUNITY): Payer: Self-pay | Admitting: *Deleted

## 2012-04-29 DIAGNOSIS — IMO0001 Reserved for inherently not codable concepts without codable children: Secondary | ICD-10-CM

## 2012-04-29 NOTE — MAU Provider Note (Signed)
  History     CSN: [id number]  Arrival date and time: 04/29/12 2009   First Provider Initiated Contact with Patient 04/29/12 2031      Chief Complaint  Patient presents with  . Labor Eval   HPI Mrs Tonae Livolsi is 26yo, [id number], with [place] [redacted]wk4d who presents with contractions and lower abdominal pressure since this morning. Pt denies water broken, but reports brownish mucus and small amount of bleeding. Also c/o nausea.   Past Medical History  Diagnosis Date  . Preeclampsia   . Varicose veins     Past Surgical History  Procedure Date  . No past surgeries     Family History  Problem Relation Age of Onset  . Diabetes Other   . Hypertension Other     History  Substance Use Topics  . Smoking status: Never Smoker   . Smokeless tobacco: Not on file  . Alcohol Use: No    Allergies: No Known Allergies  Prescriptions prior to admission  Medication Sig Dispense Refill  . Prenatal Vit-Fe Fumarate-FA (PRENATAL MULTIVITAMIN) TABS Take 1 tablet by mouth every evening.      . ranitidine (ZANTAC) 150 MG tablet Take 150 mg by mouth daily as needed. For acid reflux        Review of Systems  Constitutional: Negative for fever and chills.  Eyes: Negative for blurred vision.  Respiratory: Negative for shortness of breath.   Cardiovascular: Negative for chest pain and palpitations.  Gastrointestinal: Positive for nausea. Negative for heartburn and vomiting.  Genitourinary: Negative for dysuria and frequency.  Neurological: Negative for headaches.   Physical Exam   Blood pressure 133/78, pulse 102, temperature 97.4 F (36.3 C), temperature source Oral, resp. rate 20, last menstrual period 07/05/2011.  Physical Exam  Constitutional: She appears well-developed.  HENT:  Head: Normocephalic.  Neck: Normal range of motion.  Cardiovascular: Normal rate and regular rhythm.   Respiratory: Effort normal.  Cervical: 2.5/70/-2 at 9:45PM  Contractions: Q4-35min, mild,  regular FHT: baseline: 140s, moderate variability, accels presents, no decels. Category I  MAU Course  Procedures  MDM Monitoring for 1h Re-check in hour  Assessment and Plan  Mrs Leverne Tessler is 26yo, [phone], with GA [redacted]wk4d who presents with contractions. - Will check patient in one hour and monitoring cervical change. - If cervical change present will admitt to L&D - Cervical change: 4/80/-2 - Will admit patient.  Governor Specking 04/29/2012, 8:34 PM

## 2012-04-29 NOTE — MAU Note (Signed)
Contractions and pressure 

## 2012-04-30 ENCOUNTER — Encounter (HOSPITAL_COMMUNITY): Payer: Self-pay | Admitting: *Deleted

## 2012-04-30 LAB — CBC
HCT: 36.9 % (ref 36.0–46.0)
MCH: 31.9 pg (ref 26.0–34.0)
MCV: 92.7 fL (ref 78.0–100.0)
Platelets: 194 10*3/uL (ref 150–400)
RDW: 12.4 % (ref 11.5–15.5)
WBC: 12.8 10*3/uL — ABNORMAL HIGH (ref 4.0–10.5)

## 2012-04-30 MED ORDER — LACTATED RINGERS IV SOLN: 500.0000 mL | INTRAVENOUS | Status: DC | PRN

## 2012-04-30 MED ORDER — EPHEDRINE 5 MG/ML INJ
10.0000 mg | INTRAVENOUS | Status: DC | PRN
  Filled 2012-04-30: qty 4

## 2012-04-30 MED ORDER — DIBUCAINE 1 % RE OINT: 1.0000 "application " | TOPICAL_OINTMENT | RECTAL | Status: DC | PRN

## 2012-04-30 MED ORDER — LACTATED RINGERS IV SOLN
INTRAVENOUS | Status: DC
  Administered 2012-04-30: 01:00:00 via INTRAVENOUS

## 2012-04-30 MED ORDER — METHYLERGONOVINE MALEATE 0.2 MG/ML IJ SOLN: 0.2000 mg | INTRAMUSCULAR | Status: DC | PRN

## 2012-04-30 MED ORDER — DIPHENHYDRAMINE HCL 25 MG PO CAPS: 25.0000 mg | ORAL_CAPSULE | Freq: Four times a day (QID) | ORAL | Status: DC | PRN

## 2012-04-30 MED ORDER — METHYLERGONOVINE MALEATE 0.2 MG PO TABS: 0.2000 mg | ORAL_TABLET | ORAL | Status: DC | PRN

## 2012-04-30 MED ORDER — INFLUENZA VIRUS VACC SPLIT PF IM SUSP
0.5000 mL | INTRAMUSCULAR | Status: AC
  Administered 2012-05-01: 0.5 mL via INTRAMUSCULAR
  Filled 2012-04-30: qty 0.5

## 2012-04-30 MED ORDER — LANOLIN HYDROUS EX OINT
1.0000 | TOPICAL_OINTMENT | CUTANEOUS | Status: DC | PRN
Start: 2012-04-30 — End: 2012-05-02

## 2012-04-30 MED ORDER — SIMETHICONE 80 MG PO CHEW: 80.0000 mg | CHEWABLE_TABLET | ORAL | Status: DC | PRN

## 2012-04-30 MED ORDER — PHENYLEPHRINE 40 MCG/ML (10ML) SYRINGE FOR IV PUSH (FOR BLOOD PRESSURE SUPPORT)
80.0000 ug | PREFILLED_SYRINGE | INTRAVENOUS | Status: DC | PRN
Start: 2012-04-30 — End: 2012-04-30
  Filled 2012-04-30: qty 5

## 2012-04-30 MED ORDER — WITCH HAZEL-GLYCERIN EX PADS: 1.0000 "application " | MEDICATED_PAD | CUTANEOUS | Status: DC | PRN

## 2012-04-30 MED ORDER — OXYCODONE-ACETAMINOPHEN 5-325 MG PO TABS: 1.0000 | ORAL_TABLET | ORAL | Status: DC | PRN

## 2012-04-30 MED ORDER — PHENYLEPHRINE 40 MCG/ML (10ML) SYRINGE FOR IV PUSH (FOR BLOOD PRESSURE SUPPORT): 80.0000 ug | PREFILLED_SYRINGE | INTRAVENOUS | Status: DC | PRN

## 2012-04-30 MED ORDER — LACTATED RINGERS IV SOLN: 500.0000 mL | Freq: Once | INTRAVENOUS | Status: DC

## 2012-04-30 MED ORDER — BENZOCAINE-MENTHOL 20-0.5 % EX AERO
1.0000 "application " | INHALATION_SPRAY | CUTANEOUS | Status: DC | PRN
  Administered 2012-04-30: 1 via TOPICAL
  Filled 2012-04-30: qty 56

## 2012-04-30 MED ORDER — FENTANYL 2.5 MCG/ML BUPIVACAINE 1/10 % EPIDURAL INFUSION (WH - ANES)
14.0000 mL/h | INTRAMUSCULAR | Status: DC
  Filled 2012-04-30: qty 125

## 2012-04-30 MED ORDER — MEASLES, MUMPS & RUBELLA VAC ~~LOC~~ INJ
0.5000 mL | INJECTION | Freq: Once | SUBCUTANEOUS | Status: DC
  Filled 2012-04-30: qty 0.5

## 2012-04-30 MED ORDER — OXYTOCIN BOLUS FROM INFUSION: 500.0000 mL | INTRAVENOUS | Status: DC

## 2012-04-30 MED ORDER — PRENATAL MULTIVITAMIN CH
1.0000 | ORAL_TABLET | Freq: Every day | ORAL | Status: DC
  Administered 2012-04-30 – 2012-05-02 (×3): 1 via ORAL
  Filled 2012-04-30 (×3): qty 1

## 2012-04-30 MED ORDER — FERROUS SULFATE 325 (65 FE) MG PO TABS
325.0000 mg | ORAL_TABLET | Freq: Two times a day (BID) | ORAL | Status: DC
  Administered 2012-04-30 – 2012-05-02 (×5): 325 mg via ORAL
  Filled 2012-04-30 (×5): qty 1

## 2012-04-30 MED ORDER — ONDANSETRON HCL 4 MG PO TABS: 4.0000 mg | ORAL_TABLET | ORAL | Status: DC | PRN

## 2012-04-30 MED ORDER — ONDANSETRON HCL 4 MG/2ML IJ SOLN: 4.0000 mg | Freq: Four times a day (QID) | INTRAMUSCULAR | Status: DC | PRN

## 2012-04-30 MED ORDER — MAGNESIUM HYDROXIDE 400 MG/5ML PO SUSP: 30.0000 mL | ORAL | Status: DC | PRN

## 2012-04-30 MED ORDER — ACETAMINOPHEN 325 MG PO TABS: 650.0000 mg | ORAL_TABLET | ORAL | Status: DC | PRN

## 2012-04-30 MED ORDER — OXYTOCIN 40 UNITS IN LACTATED RINGERS INFUSION - SIMPLE MED
62.5000 mL/h | INTRAVENOUS | Status: DC
  Filled 2012-04-30: qty 1000

## 2012-04-30 MED ORDER — LIDOCAINE HCL (PF) 1 % IJ SOLN
30.0000 mL | INTRAMUSCULAR | Status: DC | PRN
  Filled 2012-04-30: qty 30

## 2012-04-30 MED ORDER — DIPHENHYDRAMINE HCL 50 MG/ML IJ SOLN: 12.5000 mg | INTRAMUSCULAR | Status: DC | PRN

## 2012-04-30 MED ORDER — TETANUS-DIPHTH-ACELL PERTUSSIS 5-2.5-18.5 LF-MCG/0.5 IM SUSP: 0.5000 mL | Freq: Once | INTRAMUSCULAR | Status: DC

## 2012-04-30 MED ORDER — IBUPROFEN 600 MG PO TABS
600.0000 mg | ORAL_TABLET | Freq: Four times a day (QID) | ORAL | Status: DC
  Administered 2012-04-30 – 2012-05-02 (×9): 600 mg via ORAL
  Filled 2012-04-30 (×10): qty 1

## 2012-04-30 MED ORDER — FLEET ENEMA 7-19 GM/118ML RE ENEM: 1.0000 | ENEMA | RECTAL | Status: DC | PRN

## 2012-04-30 MED ORDER — OXYCODONE-ACETAMINOPHEN 5-325 MG PO TABS
1.0000 | ORAL_TABLET | ORAL | Status: DC | PRN
  Administered 2012-04-30 – 2012-05-01 (×6): 1 via ORAL
  Administered 2012-05-01 – 2012-05-02 (×2): 2 via ORAL
  Administered 2012-05-02: 1 via ORAL
  Filled 2012-04-30: qty 1
  Filled 2012-04-30: qty 2
  Filled 2012-04-30 (×3): qty 1
  Filled 2012-04-30: qty 2
  Filled 2012-04-30 (×3): qty 1

## 2012-04-30 MED ORDER — ZOLPIDEM TARTRATE 5 MG PO TABS: 5.0000 mg | ORAL_TABLET | Freq: Every evening | ORAL | Status: DC | PRN

## 2012-04-30 MED ORDER — EPHEDRINE 5 MG/ML INJ: 10.0000 mg | INTRAVENOUS | Status: DC | PRN

## 2012-04-30 MED ORDER — SENNOSIDES-DOCUSATE SODIUM 8.6-50 MG PO TABS
2.0000 | ORAL_TABLET | Freq: Every day | ORAL | Status: DC
  Administered 2012-04-30 – 2012-05-01 (×2): 2 via ORAL

## 2012-04-30 MED ORDER — IBUPROFEN 600 MG PO TABS
600.0000 mg | ORAL_TABLET | Freq: Four times a day (QID) | ORAL | Status: DC | PRN
  Administered 2012-04-30: 600 mg via ORAL
  Filled 2012-04-30: qty 1

## 2012-04-30 MED ORDER — ONDANSETRON HCL 4 MG/2ML IJ SOLN: 4.0000 mg | INTRAMUSCULAR | Status: DC | PRN

## 2012-04-30 MED ORDER — CITRIC ACID-SODIUM CITRATE 334-500 MG/5ML PO SOLN: 30.0000 mL | ORAL | Status: DC | PRN

## 2012-04-30 NOTE — H&P (Addendum)
I was present for the exam and agree with above. Dating by 21 week [place].  [place], [place] 04/30/2012 9:38 AM

## 2012-04-30 NOTE — H&P (Addendum)
Debra Newton is a 26 y.o. female presenting with contractions and lower abdominal pressure since this morning.  Pt denies water broken, but reports brownish mucus and small amount of bleeding. Also c/o nausea. . Maternal Medical History:  Reason for admission: Reason for admission: contractions.  Reason for Admission:   nauseaContractions: Onset was 3-5 hours ago.   Frequency: regular.   Duration is approximately 4 minutes.   Perceived severity is moderate.    Fetal activity: Perceived fetal activity is normal.   Last perceived fetal movement was within the past hour.    Prenatal complications: no prenatal complications   OB History    Grav Para Term Preterm Abortions TAB SAB Ect Mult Living   4 3 3       3      Past Medical History  Diagnosis Date  . Preeclampsia   . Varicose veins    Past Surgical History  Procedure Date  . No past surgeries    Family History: family history includes Diabetes in her other and Hypertension in her other. Social History:  reports that she has never smoked. She does not have any smokeless tobacco history on file. She reports that she does not drink alcohol or use illicit drugs.   Prenatal Transfer Tool  Maternal Diabetes: No Genetic Screening: Normal Maternal Ultrasounds/Referrals: Normal Fetal Ultrasounds or other Referrals:  None Maternal Substance Abuse:  No Significant Maternal Medications:  None Significant Maternal Lab Results:  Lab values include: Group B Strep negative Other Comments:  None  Review of Systems  Constitutional: Negative for fever and chills.  Eyes: Negative for blurred vision and double vision.  Cardiovascular: Negative for chest pain and palpitations.  Gastrointestinal: Negative for heartburn, nausea and vomiting.  Genitourinary: Negative for dysuria and hematuria.  Musculoskeletal: Negative for myalgias.  Neurological: Negative for headaches.    Dilation: 4 Effacement (%): 80 Station: -2 Exam  by:: L. Munford RN Blood pressure 133/78, pulse 102, temperature 97.4 F (36.3 C), temperature source Oral, resp. rate 20, last menstrual period 07/05/2011. Maternal Exam:  Uterine Assessment: Contraction strength is moderate.  Contraction duration is 4 minutes. Contraction frequency is regular.   Abdomen: Patient reports no abdominal tenderness. Fetal presentation: vertex  Introitus: Normal vulva. Normal vagina.  Ferning test: not done.  Nitrazine test: not done. Amniotic fluid character: not assessed.  Pelvis: adequate for delivery.   Cervix: Cervix evaluated by digital exam.     Physical Exam  Constitutional: She is oriented to person, place, and time. She appears well-developed.  HENT:  Head: Normocephalic and atraumatic.  Neck: Normal range of motion. Neck supple.  Cardiovascular: Normal rate, regular rhythm, normal heart sounds and intact distal pulses.   Respiratory: Effort normal and breath sounds normal.  GI: Soft.  Genitourinary: Cervix exhibits no motion tenderness and no discharge.  Musculoskeletal: Normal range of motion.  Neurological: She is alert and oriented to person, place, and time.  Cervical:  4/80/-2  Contractions: Q4-25min, mild, regular  FHT: baseline: 140s, moderate variability, accels presents, no decels.  Category I  Prenatal labs: ABO, Rh:  O positive  Antibody:  negative (11/22/2011) Rubella:  Immune (11/22/2011) RPR:   Non reagent (02/17/2012)  HBsAg:   negative (11/22/2011) HIV:   negative (11/22/2011) GBS:   negative GTT: 1h: 79  Assessment/Plan: Mrs Debra Newton is 26yo, [id number], with GA [redacted]wk4d who presents with contractions.  1. Labor: Active 2. Fetal Wellbeing: Category I 3. Pain Control: Epidural 4. JYN:[id number]  Plan:  1. Admit  to [facility] per consult with MD 2. Routine L&D orders 3. Analgesia/anesthesia PRN      Governor Specking 04/30/2012, 12:34 AM

## 2012-04-30 NOTE — Progress Notes (Signed)
UR chart review completed.  

## 2012-05-01 LAB — CBC
Hemoglobin: 11.9 g/dL — ABNORMAL LOW (ref 12.0–15.0)
MCH: 31.6 pg (ref 26.0–34.0)
Platelets: 185 10*3/uL (ref 150–400)
RBC: 3.76 MIL/uL — ABNORMAL LOW (ref 3.87–5.11)
WBC: 9.4 10*3/uL (ref 4.0–10.5)

## 2012-05-01 NOTE — Progress Notes (Signed)
I saw and examined patient and agree with above. Patient c/o fatigue, pain. Not sure about going home today. Will likely stay until tomorrow. Napoleon Form, MD

## 2012-05-01 NOTE — Progress Notes (Signed)
Post Partum Day 1 Subjective: voiding, tolerating PO, + flatus and asking if she can have compression stockings for her varicose veins  Objective: Blood pressure 128/73, pulse 98, temperature 98.3 F (36.8 C), temperature source Oral, resp. rate 18, height 5\' 4"  (1.626 m), weight 85.73 kg (189 lb), last menstrual period 07/05/2011, unknown if currently breastfeeding.  Physical Exam:  General: alert, cooperative and no distress Lochia: appropriate Uterine Fundus: firm DVT Evaluation: No evidence of DVT seen on physical exam.   Basename 05/01/12 0540 04/30/12 0118  HGB 11.9* 12.7  HCT 35.3* 36.9    Assessment/Plan: Plan for discharge tomorrow Breast and bottle feeding Husband plans on getting vasectomy, but would like Depo before she leaves   LOS: 2 days   Corky Downs 05/01/2012, 7:25 AM

## 2012-05-02 ENCOUNTER — Encounter (HOSPITAL_COMMUNITY): Payer: Self-pay | Admitting: *Deleted

## 2012-05-02 ENCOUNTER — Inpatient Hospital Stay (HOSPITAL_COMMUNITY)
Admission: AD | Admit: 2012-05-02 | Discharge: 2012-05-02 | Disposition: A | Discharge status: Hospice | Payer: Self-pay | Source: Ambulatory Visit | Attending: Obstetrics and Gynecology | Admitting: Obstetrics and Gynecology

## 2012-05-02 ENCOUNTER — Ambulatory Visit (INDEPENDENT_AMBULATORY_CARE_PROVIDER_SITE_OTHER): Payer: Self-pay | Admitting: Obstetrics and Gynecology

## 2012-05-02 ENCOUNTER — Inpatient Hospital Stay (HOSPITAL_COMMUNITY): Payer: Self-pay

## 2012-05-02 VITALS — BP 113/76 | HR 87

## 2012-05-02 DIAGNOSIS — R109 Unspecified abdominal pain: Secondary | ICD-10-CM | POA: Insufficient documentation

## 2012-05-02 DIAGNOSIS — Z3049 Encounter for surveillance of other contraceptives: Secondary | ICD-10-CM

## 2012-05-02 DIAGNOSIS — R51 Headache: Secondary | ICD-10-CM | POA: Insufficient documentation

## 2012-05-02 DIAGNOSIS — K219 Gastro-esophageal reflux disease without esophagitis: Secondary | ICD-10-CM

## 2012-05-02 LAB — COMPREHENSIVE METABOLIC PANEL
ALT: 18 U/L (ref 0–35)
Alkaline Phosphatase: 102 U/L (ref 39–117)
BUN: 14 mg/dL (ref 6–23)
CO2: 24 mEq/L (ref 19–32)
Chloride: 103 mEq/L (ref 96–112)
GFR calc Af Amer: >90 mL/min (ref >90)
GFR calc non Af Amer: >90 mL/min (ref >90)
Glucose, Bld: 82 mg/dL (ref 70–99)
Potassium: 4 mEq/L (ref 3.5–5.1)
Sodium: 137 mEq/L (ref 135–145)
Total Bilirubin: 0.3 mg/dL (ref 0.3–1.2)
Total Protein: 6.3 g/dL (ref 6.0–8.3)

## 2012-05-02 LAB — PROTEIN / CREATININE RATIO, URINE
Protein Creatinine Ratio: 0.05 (ref 0.00–0.15)
Total Protein, Urine: 4.2 mg/dL

## 2012-05-02 LAB — URINE MICROSCOPIC-ADD ON

## 2012-05-02 LAB — CBC
HCT: 35.7 % — ABNORMAL LOW (ref 36.0–46.0)
Hemoglobin: 12.3 g/dL (ref 12.0–15.0)
RBC: 3.8 MIL/uL — ABNORMAL LOW (ref 3.87–5.11)
WBC: 9.6 10*3/uL (ref 4.0–10.5)

## 2012-05-02 LAB — URINALYSIS, ROUTINE W REFLEX MICROSCOPIC
Glucose, UA: NEGATIVE mg/dL
Leukocytes, UA: NEGATIVE
Nitrite: NEGATIVE
Specific Gravity, Urine: 1.02 (ref 1.005–1.030)
pH: 6 (ref 5.0–8.0)

## 2012-05-02 MED ORDER — OXYCODONE-ACETAMINOPHEN 5-325 MG PO TABS: 1.0000 | ORAL_TABLET | ORAL | Status: DC | PRN

## 2012-05-02 MED ORDER — IBUPROFEN 600 MG PO TABS: 600.0000 mg | ORAL_TABLET | Freq: Three times a day (TID) | ORAL | Status: DC

## 2012-05-02 MED ORDER — GI COCKTAIL ~~LOC~~
30.0000 mL | Freq: Once | ORAL | Status: AC
  Administered 2012-05-02: 30 mL via ORAL
  Filled 2012-05-02: qty 30

## 2012-05-02 MED ORDER — BUTORPHANOL TARTRATE 1 MG/ML IJ SOLN: 1.0000 mg | INTRAMUSCULAR | Status: DC | PRN

## 2012-05-02 MED ORDER — MEDROXYPROGESTERONE ACETATE 150 MG/ML IM SUSP
150.0000 mg | INTRAMUSCULAR | Status: DC
  Administered 2012-05-02: 150 mg via INTRAMUSCULAR

## 2012-05-02 MED ORDER — IBUPROFEN 600 MG PO TABS: 600.0000 mg | ORAL_TABLET | Freq: Four times a day (QID) | ORAL | Status: DC

## 2012-05-02 MED ORDER — OMEPRAZOLE 20 MG PO CPDR: 20.0000 mg | DELAYED_RELEASE_CAPSULE | Freq: Every day | ORAL | Status: DC

## 2012-05-02 MED ORDER — BUTORPHANOL TARTRATE 1 MG/ML IJ SOLN
1.0000 mg | Freq: Once | INTRAMUSCULAR | Status: AC
  Administered 2012-05-02: 1 mg via INTRAMUSCULAR
  Filled 2012-05-02: qty 1

## 2012-05-02 NOTE — Treatment Plan (Signed)
Dr Casper Harrison notified of patient complaints and history.  Will come to evaluate

## 2012-05-02 NOTE — H&P (Signed)
Agree with above note.  Twania Bujak H. 05/02/2012 9:14 PM

## 2012-05-02 NOTE — Progress Notes (Signed)
Unable to describe pain in R upper abd -"It just hurts"

## 2012-05-02 NOTE — MAU Note (Signed)
I delivered vaginally Monday and just went home today. Having some vaginal itching also

## 2012-05-02 NOTE — ED Provider Notes (Signed)
Chief Complaint:  Headache and Abdominal Pain with RUQ pain, vomiting, "feels weak"  Debra Newton is  26 y.o. [phone].  Patient's last menstrual period was 07/05/2011.. . She had an uncomplicated pregnancy and delivered vaginally without problems 2 days ago and was discharged home this afternoon.  She states that she woke up with a headache and it has gotten worse all day.  The headache is in the top of her head.  C/O pain over area of stomach with deep palpation.  "feels like my gastritis".  Ate several fried flautas today.  POC discussed with Dr. Jolayne Panther and orders for [facility] labs/gb ultrasound  OB History    Grav Para Term Preterm Abortions TAB SAB Ect Mult Living   4 4 4  0 0 0 0 0 0 4       Past Medical History  Diagnosis Date  . Preeclampsia   . Varicose veins     Past Surgical History  Procedure Date  . No past surgeries     Family History  Problem Relation Age of Onset  . Diabetes Other   . Hypertension Other     History  Substance Use Topics  . Smoking status: Never Smoker   . Smokeless tobacco: Not on file  . Alcohol Use: No    Allergies: No Known Allergies  Prescriptions prior to admission  Medication Sig Dispense Refill  . ibuprofen (ADVIL,MOTRIN) 600 MG tablet Take 1 tablet (600 mg total) by mouth every 6 (six) hours.  50 tablet  1  . oxyCODONE-acetaminophen (PERCOCET/ROXICET) 5-325 MG per tablet Take 1-2 tablets by mouth every 3 (three) hours as needed (moderate - severe pain).  15 tablet  0  . Prenatal Vit-Fe Fumarate-FA (PRENATAL MULTIVITAMIN) TABS Take 1 tablet by mouth every evening.        Review of Systems - Negative except for aforementioned complaints.  Physical Exam   Blood pressure 124/73, pulse 91, temperature 98.3 F (36.8 C), temperature source Oral, resp. rate 22, height 5\' 3"  (1.6 m), weight 185 lb (83.915 kg), last menstrual period 07/05/2011, SpO2 96.00%, currently breastfeeding.  General: General appearance -  oriented to person, place, and time and appears to feel bad Chest - clear to auscultation, no wheezes, rales or rhonchi, symmetric air entry Heart - normal rate and regular rhythm Abdomen -Negative Murphy's sign, tender to palpation in RUQ and substernal area Pelvic - examination not indicated Extremities - peripheral pulses normal, no pedal edema, no clubbing or cyanosis; grip was initially weak bilaterally, but now is normal  Labs: Recent Results (from the past 24 hour(s))  URINALYSIS, ROUTINE W REFLEX MICROSCOPIC   Collection Time   05/02/12  7:15 PM      Component Value Range   Color, Urine YELLOW  YELLOW   APPearance CLEAR  CLEAR   Specific Gravity, Urine 1.020  1.005 - 1.030   pH 6.0  5.0 - 8.0   Glucose, UA NEGATIVE  NEGATIVE mg/dL   Hgb urine dipstick LARGE (*) NEGATIVE   Bilirubin Urine NEGATIVE  NEGATIVE   Ketones, ur NEGATIVE  NEGATIVE mg/dL   Protein, ur NEGATIVE  NEGATIVE mg/dL   Urobilinogen, UA 0.2  0.0 - 1.0 mg/dL   Nitrite NEGATIVE  NEGATIVE   Leukocytes, UA NEGATIVE  NEGATIVE  URINE MICROSCOPIC-ADD ON   Collection Time   05/02/12  7:15 PM      Component Value Range   Squamous Epithelial / LPF RARE  RARE   WBC, UA 0-2  <  3 WBC/hpf   RBC / HPF 7-10  <3 RBC/hpf  CBC   Collection Time   05/02/12  8:25 PM      Component Value Range   WBC 9.6  4.0 - 10.5 K/uL   RBC 3.80 (*) 3.87 - 5.11 MIL/uL   Hemoglobin 12.3  12.0 - 15.0 g/dL   HCT 16.1 (*) 09.6 - 04.5 %   MCV 93.9  78.0 - 100.0 fL   MCH 32.4  26.0 - 34.0 pg   MCHC 34.5  30.0 - 36.0 g/dL   RDW 40.9  81.1 - 91.4 %   Platelets 188  150 - 400 K/uL  COMPREHENSIVE METABOLIC PANEL   Collection Time   05/02/12  8:25 PM      Component Value Range   Sodium 137  135 - 145 mEq/L   Potassium 4.0  3.5 - 5.1 mEq/L   Chloride 103  96 - 112 mEq/L   CO2 24  19 - 32 mEq/L   Glucose, Bld 82  70 - 99 mg/dL   BUN 14  6 - 23 mg/dL   Creatinine, Ser 7.82  0.50 - 1.10 mg/dL   Calcium 9.6  8.4 - 95.6 mg/dL   Total Protein 6.3   6.0 - 8.3 g/dL   Albumin 2.7 (*) 3.5 - 5.2 g/dL   AST 28  0 - 37 U/L   ALT 18  0 - 35 U/L   Alkaline Phosphatase 102  39 - 117 U/L   Total Bilirubin 0.3  0.3 - 1.2 mg/dL   GFR calc non Af Amer >90  >90 mL/min   GFR calc Af Amer >90  >90 mL/min   Imaging Studies:  *RADIOLOGY REPORT*  Clinical Data: Lower quadrant abdominal pain, nausea and vomiting.  2 days postpartum.  ABDOMINAL ULTRASOUND LIMITED (RIGHT UPPER QUADRANT)  Comparison: CT of the abdomen and pelvis from 01/10/2011  Findings:  Gallbladder: The gallbladder is distended but normal in  appearance, without evidence for gallstones, gallbladder wall  thickening or pericholecystic fluid. No ultrasonographic Murphy's  sign is elicited, though there is tenderness over the area of the  gallbladder.  Common Bile Duct: 0.3 cm in diameter; within normal limits in  caliber.  Liver: Normal parenchymal echogenicity and echotexture; no focal  lesions identified. The liver is mildly prominent, measuring 17.4  cm in diameter. Minimal areas of increased echogenicity within the  liver may reflect focal fatty infiltration. Limited Doppler  evaluation demonstrates normal blood flow within the liver.  IMPRESSION:  1. Gallbladder grossly unremarkable in appearance. No  ultrasonographic Murphy's sign elicited. However, there is  tenderness at the region of the gallbladder, nonspecific in nature.  2. Liver borderline prominent in size, though this may remain  within normal limits.  Original Report Authenticated By: Tonia Ghent, M.D.    Assessment: No evidence of preeclampsia Normal abdominal ultrasound Pt's condition has improved without intervention since arrival except for headache  Plan: Try GI cocktail and IM stadol for headache  GI cocktail completely relieved abdominal pain, HA is "almost completely gone"  Will discharge pt home with Rx for prilosec  CRESENZO-DISHMAN,Yu Cragun

## 2012-05-02 NOTE — MAU Note (Signed)
I woke up with headache this morning about 0630. Took Motrin and Percocet and did not help. Headache on L side of head. No hx h/a. Yesterday had numbness and pain L arm and L leg. Just have alittle now in L lower arm. Pain in R upper abdomen. Vomited 3 times. No diarrhea.

## 2012-05-02 NOTE — MAU Note (Signed)
Delivered baby on Monday.  Started with HA at 0630 this morning.  Was given Motrin and Percocet by RN staff on Mother baby unit, headache never got better only worse.  Having right upper stomach pain that started in the afternoon after discharge.  Has dizziness and vomiting x3.  Feels like heart is beating real fast and heaviness in the chest.

## 2012-05-02 NOTE — Discharge Summary (Signed)
Obstetric Discharge Summary Reason for Admission: onset of labor Prenatal Procedures: none Intrapartum Procedures: spontaneous vaginal delivery Postpartum Procedures: none Complications-Operative and Postpartum: none Hemoglobin  Date Value Range Status  05/01/2012 11.9* 12.0 - 15.0 g/dL Final     HCT  Date Value Range Status  05/01/2012 35.3* 36.0 - 46.0 % Final    Physical Exam:  General: alert, cooperative and no distress Lochia: appropriate Uterine Fundus: firm DVT Evaluation: No evidence of DVT seen on physical exam.  Discharge Diagnoses: Term Pregnancy-delivered  Discharge Information: Date: 05/02/2012 Activity: unrestricted Diet: routine Medications: PNV, Ibuprofen, Colace and Percocet Condition: stable Instructions: refer to practice specific booklet Discharge to: home   Newborn Data: Live born female  Birth Weight: 7 lb 1.8 oz (3226 g) APGAR: 9, 9  Home with mother. Breast and bottle feeding. Plans to get Depo today, husband will get vasectomy in the next 3 months.   Corky Downs 05/02/2012, 7:22 AM  I have seen and examined this patient and I agree with the above. Cam Hai 7:49 AM 05/02/2012

## 2012-05-02 NOTE — MAU Provider Note (Signed)
Agree with above note. CNM Smith admitted with resident. Kelcey Korus H. 05/02/2012 9:04 PM

## 2012-05-03 NOTE — Discharge Summary (Signed)
Attestation of Attending Supervision of Advanced Practitioner (CNM/NP): Evaluation and management procedures were performed by the Advanced Practitioner under my supervision and collaboration.  I have reviewed the Advanced Practitioner's note and chart, and I agree with the management and plan.  HARRAWAY-SMITH, Lexxie Winberg 10:20 AM

## 2012-05-04 NOTE — ED Provider Notes (Signed)
Attestation of Attending Supervision of Advanced Practitioner (CNM/NP): Evaluation and management procedures were performed by the Advanced Practitioner under my supervision and collaboration.  I have reviewed the Advanced Practitioner's note and chart, and I agree with the management and plan.  Jerritt Cardoza 05/04/2012 8:57 AM

## 2012-05-07 NOTE — H&P (Signed)
Patient admitted by [place]. Agree with above.

## 2012-05-08 ENCOUNTER — Telehealth (HOSPITAL_COMMUNITY): Payer: Self-pay | Admitting: *Deleted

## 2012-05-08 NOTE — Telephone Encounter (Signed)
Resolve episode 

## 2012-11-16 ENCOUNTER — Encounter: Payer: Self-pay | Admitting: *Deleted

## 2012-11-16 NOTE — Progress Notes (Signed)
Patient came into office yesterday to request records.  Release scanned into system.  Records were printed and Vanessa [place] will contact patient today to let her know records are ready for pick-up.

## 2013-12-27 ENCOUNTER — Encounter: Payer: Self-pay | Admitting: Obstetrics and Gynecology

## 2013-12-27 ENCOUNTER — Ambulatory Visit: Payer: Medicaid Other | Admitting: Nurse Practitioner

## 2013-12-27 ENCOUNTER — Telehealth: Payer: Self-pay | Admitting: Obstetrics and Gynecology

## 2013-12-27 NOTE — Telephone Encounter (Signed)
Pt missed appt today 12/27/13 @ 1020. No answer on phone number on file and no VM available. Letter sent.

## 2014-03-31 ENCOUNTER — Encounter (HOSPITAL_COMMUNITY): Payer: Self-pay | Admitting: *Deleted

## 2015-08-20 DIAGNOSIS — I83811 Varicose veins of right lower extremities with pain: Secondary | ICD-10-CM | POA: Insufficient documentation

## 2017-03-27 ENCOUNTER — Other Ambulatory Visit: Payer: Self-pay | Admitting: Family Medicine

## 2017-03-27 DIAGNOSIS — I863 Vulval varices: Secondary | ICD-10-CM

## 2017-04-13 ENCOUNTER — Other Ambulatory Visit: Payer: Self-pay

## 2017-09-14 ENCOUNTER — Other Ambulatory Visit: Payer: Self-pay | Admitting: Family Medicine

## 2017-09-14 DIAGNOSIS — I863 Vulval varices: Secondary | ICD-10-CM

## 2017-09-21 ENCOUNTER — Other Ambulatory Visit: Payer: Self-pay | Admitting: Radiology

## 2017-09-21 ENCOUNTER — Ambulatory Visit
Admission: RE | Admit: 2017-09-21 | Discharge: 2017-09-21 | Disposition: A | Discharge status: Home / Self Care | Payer: No Typology Code available for payment source | Source: Ambulatory Visit | Attending: Family Medicine | Admitting: Family Medicine

## 2017-09-21 ENCOUNTER — Encounter: Payer: Self-pay | Admitting: Radiology

## 2017-09-21 ENCOUNTER — Other Ambulatory Visit (HOSPITAL_COMMUNITY): Payer: Self-pay | Admitting: Interventional Radiology

## 2017-09-21 DIAGNOSIS — I863 Vulval varices: Secondary | ICD-10-CM

## 2017-09-21 HISTORY — PX: IR RADIOLOGIST EVAL & MGMT: IMG5224

## 2017-09-22 ENCOUNTER — Ambulatory Visit (HOSPITAL_COMMUNITY)
Admission: RE | Admit: 2017-09-22 | Discharge: 2017-09-22 | Disposition: A | Discharge status: Home / Self Care | Payer: No Typology Code available for payment source | Source: Ambulatory Visit | Attending: Interventional Radiology | Admitting: Interventional Radiology

## 2017-09-22 DIAGNOSIS — I863 Vulval varices: Secondary | ICD-10-CM

## 2017-09-22 MED ORDER — IOHEXOL 300 MG/ML  SOLN
100.0000 mL | Freq: Once | INTRAMUSCULAR | Status: AC | PRN
  Administered 2017-09-22: 100 mL via INTRAVENOUS

## 2017-09-27 ENCOUNTER — Encounter: Payer: Self-pay | Admitting: Student

## 2017-09-28 ENCOUNTER — Encounter: Payer: Self-pay | Admitting: Obstetrics & Gynecology

## 2017-09-28 ENCOUNTER — Ambulatory Visit: Payer: Self-pay | Admitting: Obstetrics & Gynecology

## 2017-09-28 ENCOUNTER — Telehealth (HOSPITAL_COMMUNITY): Payer: Self-pay | Admitting: Radiology

## 2017-09-28 VITALS — BP 117/65 | HR 86 | Wt 186.2 lb

## 2017-09-28 DIAGNOSIS — R102 Pelvic and perineal pain: Secondary | ICD-10-CM

## 2017-09-28 DIAGNOSIS — N939 Abnormal uterine and vaginal bleeding, unspecified: Secondary | ICD-10-CM

## 2017-09-28 DIAGNOSIS — N9489 Other specified conditions associated with female genital organs and menstrual cycle: Secondary | ICD-10-CM

## 2017-09-28 LAB — POCT URINALYSIS DIP (DEVICE)
Bilirubin Urine: NEGATIVE
Glucose, UA: NEGATIVE mg/dL
KETONES UR: NEGATIVE mg/dL
Leukocytes, UA: NEGATIVE
Nitrite: NEGATIVE
PH: 5.5 (ref 5.0–8.0)
PROTEIN: NEGATIVE mg/dL
SPECIFIC GRAVITY, URINE: 1.01 (ref 1.005–1.030)
Urobilinogen, UA: 0.2 mg/dL (ref 0.0–1.0)

## 2017-09-28 MED ORDER — NORETHINDRONE ACETATE 5 MG PO TABS: 5.0000 mg | ORAL_TABLET | Freq: Every day | ORAL | 2 refills | Status: DC

## 2017-09-28 NOTE — Progress Notes (Signed)
GYNECOLOGY OFFICE VISIT NOTE  History:  32 y.o. [id number] here today for evaluation of multiple symptoms. She reports extreme fatigue, irregular heavy painful periods for several months, hair loss, lower abdominal pain, feeling of suprapubic pressure. No fevers or other systemic symptoms.   Past Medical History:  Diagnosis Date  . Preeclampsia   . Varicose veins     Past Surgical History:  Procedure Laterality Date  . IR RADIOLOGIST EVAL & MGMT  09/21/2017    The following portions of the patient's history were reviewed and updated as appropriate: allergies, current medications, past family history, past medical history, past social history, past surgical history and problem list.   Health Maintenance:  Normal pap last year.  Review of Systems:  Pertinent items noted in HPI and remainder of comprehensive ROS otherwise negative.  Objective:  Physical Exam BP 117/65 (BP Location: Right Arm, Patient Position: Sitting, Cuff Size: Normal)   Pulse 86   Wt 186 lb 3.2 oz (84.5 kg)   LMP 08/31/2017 (Within Days)   Breastfeeding? Unknown   BMI 32.98 kg/m  CONSTITUTIONAL: Well-developed, well-nourished female in no acute distress.  CARDIOVASCULAR: Normal heart rate noted RESPIRATORY: Effort and breath sounds normal, no problems with respiration noted ABDOMEN: Soft, no distention noted. No pain on palpation.  PELVIC: Deferred MUSCULOSKELETAL: Normal range of motion. No edema noted.  Labs and Imaging Ct Venogram Abd/pel  Result Date: 09/22/2017 CLINICAL DATA:  Labial varicosities, pelvic varicose veins. Dyspnea, chest tightness, dizziness, weakness, pelvic pressure and pain, nausea, constipation. EXAM: CTA ABDOMEN AND PELVIS WITH CONTRAST TECHNIQUE: Multidetector CT imaging of the abdomen and pelvis was performed using the venous phase protocol during bolus administration of intravenous contrast. Multiplanar reconstructed images and MIPs were obtained and reviewed to evaluate the  vascular anatomy. CONTRAST:  OMNIPAQUE IOHEXOL 300 MG/ML  SOLN COMPARISON:  None. FINDINGS: VASCULAR Aorta: Normal caliber aorta without aneurysm, dissection, vasculitis or significant stenosis. Celiac: Patent without evidence of aneurysm, dissection, vasculitis or significant stenosis. SMA: Patent without evidence of aneurysm, dissection, vasculitis or significant stenosis. Renals: Both renal arteries are patent without evidence of aneurysm, dissection, vasculitis, fibromuscular dysplasia or significant stenosis. IMA: Patent without evidence of aneurysm, dissection, vasculitis or significant stenosis. Inflow: Patent without evidence of aneurysm, dissection, vasculitis or significant stenosis. Proximal Outflow: Bilateral common femoral and visualized portions of the superficial and profunda femoral arteries are patent without evidence of aneurysm, dissection, vasculitis or significant stenosis. Veins: On portal venous phase imaging, there is abnormal enhancement of the dilated left gonadal vein consistent with reflux. This supplies enlarged tortuous venous collateral channels around the uterus and bilateral ovaries. The right gonadal vein is prominent and patent without evidence of reflux. On the right, there is an enlarged tortuous vein communicating between the right adnexal plexus and engorged subcutaneous vulvar veins which extend across the midline. Small varicose veins are noted in the proximal right thigh. The visualized proximal great saphenous veins appear normal in caliber. Patent iliac venous system, IVC, bilateral renal veins, portal vein, splenic vein, superior and inferior mesenteric veins. Review of the MIP images confirms the above findings. NON-VASCULAR Lower chest: No acute abnormality. Hepatobiliary: No focal liver abnormality is seen. No gallstones, gallbladder wall thickening, or biliary dilatation. Pancreas: Unremarkable. No pancreatic ductal dilatation or surrounding inflammatory  changes. Spleen: Normal in size without focal abnormality. Adrenals/Urinary Tract: Adrenal glands are unremarkable. Kidneys are normal, without renal calculi, focal lesion, or hydronephrosis. Bladder is unremarkable. Stomach/Bowel: Stomach is incompletely distended. Small bowel decompressed. Moderate proximal  colonic fecal material, decompressed distally. Lymphatic: No abdominal or pelvic adenopathy. Reproductive: Enlarged venous plexus around the uterus and bilateral adnexa as above. No adnexal mass. Other: No ascites.  No free air. Musculoskeletal: No acute or significant osseous findings. IMPRESSION: VASCULAR 1. Enlarged refluxing left gonadal vein associated with a large network of dilated veins around the uterus and bilateral adnexal regions suggesting pelvic congestion syndrome. Correlate with symptomatology. 2. The right gonadal vein is enlarged but does not show reflux during the CT examination. 3. Enlarged communicating vein between the right ovarian venous plexus and enlarged tortuous subcutaneous veins in the vulvar region which extend towards the labia and across the midline. NON-VASCULAR 1. Unremarkable Electronically Signed   By: Corlis Leak M.D.   On: 09/22/2017 13:28   Ir Radiologist Eval & Mgmt  Result Date: 09/21/2017 Please refer to notes tab for details about interventional procedure. (Op Note)  Results for orders placed or performed in visit on 09/28/17 (from the past 24 hour(s))  POCT urinalysis dip (device)     Status: Abnormal   Collection Time: 09/28/17  4:02 PM  Result Value Ref Range   Glucose, UA NEGATIVE NEGATIVE mg/dL   Bilirubin Urine NEGATIVE NEGATIVE   Ketones, ur NEGATIVE NEGATIVE mg/dL   Specific Gravity, Urine 1.010 1.005 - 1.030   Hgb urine dipstick TRACE (A) NEGATIVE   pH 5.5 5.0 - 8.0   Protein, ur NEGATIVE NEGATIVE mg/dL   Urobilinogen, UA 0.2 0.0 - 1.0 mg/dL   Nitrite NEGATIVE NEGATIVE   Leukocytes, UA NEGATIVE NEGATIVE    Assessment & Plan:  1.  Suprapubic cramping - Urine Culture sent Could also be due to pelvic congestion syndrome.   2. Abnormal uterine bleeding (AUB)/Hair loss/Fatigue Will do laboratory evaluation; will follow up results and manage accordingly..  Do trial of Aygestin for AUB/pelvic congestion syndrome.  - norethindrone (AYGESTIN) 5 MG tablet; Take 1 tablet (5 mg total) by mouth daily.  Dispense: 30 tablet; Refill: 2 - CBC - Testosterone,Free and Total - Prolactin - TSH - Follicle stimulating hormone - Beta hCG quant (ref lab)  3. Female pelvic congestion syndrome Has some pain, takes NSAIDs as needed.  Aygestin prescribed.  Will continue to follow up symptoms.  Routine preventative health maintenance measures emphasized. Please refer to After Visit Summary for other counseling recommendations.   Return if symptoms worsen or fail to improve.   Total face-to-face time with patient: 20 minutes.  Over 50% of encounter was spent on counseling and coordination of care.   Jaynie Collins, MD, FACOG Obstetrician & Gynecologist, [facility] for [employer], [facility]

## 2017-09-28 NOTE — Telephone Encounter (Signed)
Called pt, left VM for her to call me and let me know if we can put her procedure on with Yama and McCullough on for 10/27/17. JM

## 2017-09-28 NOTE — Progress Notes (Signed)
Pt c/o extreme fatigue, irregular heavy painful periods, hair loss, lower abd pain, feeling of suprapubic pressure. UA has trace blood. Will send for cx.

## 2017-09-29 LAB — CBC
Hematocrit: 38.4 % (ref 34.0–46.6)
Hemoglobin: 12.6 g/dL (ref 11.1–15.9)
MCH: 31.3 pg (ref 26.6–33.0)
MCHC: 32.8 g/dL (ref 31.5–35.7)
MCV: 96 fL (ref 79–97)
PLATELETS: 233 10*3/uL (ref 150–379)
RBC: 4.02 x10E6/uL (ref 3.77–5.28)
RDW: 12.6 % (ref 12.3–15.4)
WBC: 7.8 10*3/uL (ref 3.4–10.8)

## 2017-09-29 LAB — PROLACTIN: PROLACTIN: 13.5 ng/mL (ref 4.8–23.3)

## 2017-09-29 LAB — TESTOSTERONE,FREE AND TOTAL
TESTOSTERONE FREE: 1.3 pg/mL (ref 0.0–4.2)
Testosterone: 18 ng/dL (ref 8–48)

## 2017-09-29 LAB — FOLLICLE STIMULATING HORMONE: FSH: 2.9 m[IU]/mL

## 2017-09-29 LAB — BETA HCG QUANT (REF LAB): hCG Quant: <1 m[IU]/mL

## 2017-09-29 LAB — TSH: TSH: 4.14 u[IU]/mL (ref 0.450–4.500)

## 2017-09-30 LAB — URINE CULTURE: Organism ID, Bacteria: NO GROWTH

## 2017-10-02 LAB — BETA HCG QUANT (REF LAB)

## 2017-10-05 ENCOUNTER — Telehealth: Payer: Self-pay | Admitting: Obstetrics & Gynecology

## 2017-10-05 NOTE — Telephone Encounter (Signed)
Pt stopped by for lab results from 09/28/17. Please call pt [phone] with results today.

## 2017-10-09 NOTE — Consult Note (Signed)
Chief Complaint: Patient was seen in consultation today for vulvar and labial varicosities at the request of Greenberg,Brent  Referring Physician(s): Dr. Elza Rafter  History of Present Illness: Debra Newton is a 32 y.o.G73P4 hispanic female with a history of venous insufficiency of the right lower extremity treated with saphenous laser ablation, phlebectomy and sclerotherapy by Dr. Jones Skene. She also has had symptomatic vulvar and labial varicosities for approximately 10 years with symptoms of aching, heaviness, hot sensation, swelling and pressure in the labial and vulvar regions much of the time and worse with prolonged standing. These symptoms have been worsening over the last year or two. She feels debilitated during the day at times and over the counter pain medications have not helped. Associated labial skin discoloration has been worsening.  Symptoms are worse during menstrual periods.  She has a history of irregular periods roughly every 3-6 months with 2 days of heavy bleeding.  She does not use oral contraceptives or an IUD.  Sexual intercourse is painful.  She denies any previous bleeding from the varicosities. She has an upcoming gynecologic appointment at [facility].  Past Medical History:  Diagnosis Date  . Preeclampsia   . Varicose veins     Past Surgical History:  Procedure Laterality Date  . IR RADIOLOGIST EVAL & MGMT  09/21/2017    Allergies: Patient has no known allergies.  Medications: Prior to Admission medications   Medication Sig Start Date End Date Taking? Authorizing Provider  Multiple Vitamin (MULTIVITAMIN) tablet Take 1 tablet by mouth daily.   Yes [provider]  Omega-3 Fatty Acids (OMEGA 3 PO) Take 1 tablet by mouth daily.   Yes [provider]  Probiotic Product (ALIGN PO) Take 1 tablet by mouth daily.   Yes [provider]  aspirin-acetaminophen-caffeine (EXCEDRIN  MIGRAINE) [phone] MG tablet Take by mouth every 6 (six) hours as needed (for cramping).    [provider]  norethindrone (AYGESTIN) 5 MG tablet Take 1 tablet (5 mg total) by mouth daily. 09/28/17   Anyanwu, Sallyanne Havers, MD  Prenatal Vit-Fe Fumarate-FA (PRENATAL MULTIVITAMIN) TABS Take 1 tablet by mouth every evening.    [provider]     Family History  Problem Relation Age of Onset  . Diabetes Other   . Hypertension Other     Social History   Socioeconomic History  . Marital status: Married    Spouse name: Not on file  . Number of children: Not on file  . Years of education: Not on file  . Highest education level: Not on file  Occupational History  . Not on file  Social Needs  . Financial resource strain: Not on file  . Food insecurity:    Worry: Not on file    Inability: Not on file  . Transportation needs:    Medical: Not on file    Non-medical: Not on file  Tobacco Use  . Smoking status: Never Smoker  . Smokeless tobacco: Never Used  Substance and Sexual Activity  . Alcohol use: No  . Drug use: No  . Sexual activity: Yes  Lifestyle  . Physical activity:    Days per week: Not on file    Minutes per session: Not on file  . Stress: Not on file  Relationships  . Social connections:    Talks on phone: Not on file    Gets together: Not on file    Attends religious service: Not on file  Active member of club or organization: Not on file    Attends meetings of clubs or organizations: Not on file    Relationship status: Not on file  Other Topics Concern  . Not on file  Social History Narrative  . Not on file    Review of Systems: A 12 point ROS discussed and pertinent positives are indicated in the HPI above.  All other systems are negative.  Review of Systems  Constitutional: Negative.   HENT: Negative.   Respiratory: Negative.   Cardiovascular: Negative.   Gastrointestinal: Negative.   Genitourinary: Positive for dyspareunia, pelvic  pain and vaginal pain. Negative for decreased urine volume, difficulty urinating, dysuria, enuresis, flank pain, frequency, genital sores, hematuria, urgency and vaginal discharge.  Musculoskeletal: Negative.   Skin: Positive for color change.  Neurological: Negative.   Hematological: Negative.     Vital Signs: BP 107/75   Pulse 83   Temp 98.1 F (36.7 C) (Oral)   Resp 14   Ht '5\' 3"'$  (1.6 m)   Wt 189 lb (85.7 kg)   LMP 08/31/2017 (Approximate)   SpO2 99%   BMI 33.48 kg/m   Physical Exam  Constitutional: She is oriented to person, place, and time. She appears well-developed and well-nourished. No distress.  HENT:  Head: Normocephalic and atraumatic.  Neck: Neck supple. No JVD present.  Cardiovascular: Normal rate, regular rhythm and normal heart sounds. Exam reveals no gallop and no friction rub.  No murmur heard. Pulmonary/Chest: Effort normal and breath sounds normal. No stridor. No respiratory distress. She has no wheezes. She has no rales.  Abdominal: Soft. Bowel sounds are normal. She exhibits no distension and no mass. There is no tenderness. There is no rebound and no guarding.  Genitourinary:  Genitourinary Comments: Bilateral labial varicosities with associated labial hyperpigmentation.  Prominent superficial veins overlying vulvar/mons pubic region.  No ulcerations.  Musculoskeletal: She exhibits no edema.  Lymphadenopathy:    She has no cervical adenopathy.  Neurological: She is alert and oriented to person, place, and time.  Skin: Skin is warm and dry. She is not diaphoretic.  Vitals reviewed.   Imaging: Ct Venogram Abd/pel  Result Date: 09/22/2017 CLINICAL DATA:  Labial varicosities, pelvic varicose veins. Dyspnea, chest tightness, dizziness, weakness, pelvic pressure and pain, nausea, constipation. EXAM: CTA ABDOMEN AND PELVIS WITH CONTRAST TECHNIQUE: Multidetector CT imaging of the abdomen and pelvis was performed using the venous phase protocol during bolus  administration of intravenous contrast. Multiplanar reconstructed images and MIPs were obtained and reviewed to evaluate the vascular anatomy. CONTRAST:  148m OMNIPAQUE IOHEXOL 300 MG/ML  SOLN COMPARISON:  None. FINDINGS: VASCULAR Aorta: Normal caliber aorta without aneurysm, dissection, vasculitis or significant stenosis. Celiac: Patent without evidence of aneurysm, dissection, vasculitis or significant stenosis. SMA: Patent without evidence of aneurysm, dissection, vasculitis or significant stenosis. Renals: Both renal arteries are patent without evidence of aneurysm, dissection, vasculitis, fibromuscular dysplasia or significant stenosis. IMA: Patent without evidence of aneurysm, dissection, vasculitis or significant stenosis. Inflow: Patent without evidence of aneurysm, dissection, vasculitis or significant stenosis. Proximal Outflow: Bilateral common femoral and visualized portions of the superficial and profunda femoral arteries are patent without evidence of aneurysm, dissection, vasculitis or significant stenosis. Veins: On portal venous phase imaging, there is abnormal enhancement of the dilated left gonadal vein consistent with reflux. This supplies enlarged tortuous venous collateral channels around the uterus and bilateral ovaries. The right gonadal vein is prominent and patent without evidence of reflux. On the right, there is an enlarged tortuous  vein communicating between the right adnexal plexus and engorged subcutaneous vulvar veins which extend across the midline. Small varicose veins are noted in the proximal right thigh. The visualized proximal great saphenous veins appear normal in caliber. Patent iliac venous system, IVC, bilateral renal veins, portal vein, splenic vein, superior and inferior mesenteric veins. Review of the MIP images confirms the above findings. NON-VASCULAR Lower chest: No acute abnormality. Hepatobiliary: No focal liver abnormality is seen. No gallstones, gallbladder wall  thickening, or biliary dilatation. Pancreas: Unremarkable. No pancreatic ductal dilatation or surrounding inflammatory changes. Spleen: Normal in size without focal abnormality. Adrenals/Urinary Tract: Adrenal glands are unremarkable. Kidneys are normal, without renal calculi, focal lesion, or hydronephrosis. Bladder is unremarkable. Stomach/Bowel: Stomach is incompletely distended. Small bowel decompressed. Moderate proximal colonic fecal material, decompressed distally. Lymphatic: No abdominal or pelvic adenopathy. Reproductive: Enlarged venous plexus around the uterus and bilateral adnexa as above. No adnexal mass. Other: No ascites.  No free air. Musculoskeletal: No acute or significant osseous findings. IMPRESSION: VASCULAR 1. Enlarged refluxing left gonadal vein associated with a large network of dilated veins around the uterus and bilateral adnexal regions suggesting pelvic congestion syndrome. Correlate with symptomatology. 2. The right gonadal vein is enlarged but does not show reflux during the CT examination. 3. Enlarged communicating vein between the right ovarian venous plexus and enlarged tortuous subcutaneous veins in the vulvar region which extend towards the labia and across the midline. NON-VASCULAR 1. Unremarkable Electronically Signed   By: Lucrezia Europe M.D.   On: 09/22/2017 13:28   Ir Radiologist Eval & Mgmt  Result Date: 09/21/2017 Please refer to notes tab for details about interventional procedure. (Op Note)   Labs:  CBC: Recent Labs    09/28/17 1628  WBC 7.8  HGB 12.6  HCT 38.4  PLT 233    Assessment and Plan:  Vulvar and labial varicosities are consistent with venous insufficiency, potentially of gonadal/ovarian veins or internal iliac veins.  No lower extremity edema to suggest underlying May Thurner physiology.  I met with Mrs. Debra Newton and her husband to discuss etiology of varicosities and possible treatment options.  Her symptoms have clearly been worsening over the  last year or two and she is fairly debilitated during the day with discomfort.  Varicosities are prominent on exam, suggestive of significant venous insufficiency.  I recommended initial evaluation with CT venography of the abdomen and pelvis to evaluate for venous supply to varicosities and for planning of possible transcatheter venography and intervention.  If CT shows evidence of gonadal vein supply or clear internal iliac branch supply, she may be a good candidate for transcatheter sclerotherapy and embolization to treat and occlude the varicosities. She is agreeable to this plan.  Thank you for this interesting consult.  I greatly enjoyed meeting Debra Newton and look forward to participating in their care.  A copy of this report was sent to the requesting provider on this date.  Imaging addendum: CT was performed the day after consultation and demonstrates bilateral enlarged ovarian veins with tributaries in pelvis communicating with prominent superficial vulvar, pelvic floor and labial varicosities.  This pattern by imaging would be favorable to transcatheter therapy with balloon occlusion sclerotherapy and obliteration of varicosites and possible coil embolization after sclerotherapy to maximize long-term varicosity occlusion.  We will begin the scheduling process for venography and possible sclerotherapy and embolization at [facility].  Electronically SignedAletta Edouard T 10/09/2017, 8:10 AM   I spent a total of 40 Minutes in face to face  in clinical consultation, greater than 50% of which was counseling/coordinating care for pelvic varicosities and venous insufficiency.

## 2017-10-18 ENCOUNTER — Other Ambulatory Visit (HOSPITAL_COMMUNITY): Payer: Self-pay | Admitting: Interventional Radiology

## 2017-10-18 DIAGNOSIS — N9489 Other specified conditions associated with female genital organs and menstrual cycle: Secondary | ICD-10-CM

## 2017-10-18 DIAGNOSIS — R102 Pelvic and perineal pain: Secondary | ICD-10-CM

## 2017-10-18 DIAGNOSIS — N939 Abnormal uterine and vaginal bleeding, unspecified: Secondary | ICD-10-CM

## 2017-10-18 NOTE — Telephone Encounter (Signed)
Called patient no answer. I have check on her lab results hcg and results are back to normal at this time.

## 2017-10-26 ENCOUNTER — Other Ambulatory Visit: Payer: Self-pay | Admitting: Radiology

## 2017-10-26 ENCOUNTER — Other Ambulatory Visit: Payer: Self-pay | Admitting: Student

## 2017-10-27 ENCOUNTER — Ambulatory Visit (HOSPITAL_COMMUNITY)
Admission: RE | Admit: 2017-10-27 | Discharge: 2017-10-27 | Disposition: A | Discharge status: Home / Self Care | Payer: Self-pay | Source: Ambulatory Visit | Attending: Interventional Radiology | Admitting: Interventional Radiology

## 2017-10-27 ENCOUNTER — Other Ambulatory Visit (HOSPITAL_COMMUNITY): Payer: Self-pay | Admitting: Interventional Radiology

## 2017-10-27 ENCOUNTER — Encounter (HOSPITAL_COMMUNITY): Payer: Self-pay

## 2017-10-27 DIAGNOSIS — I863 Vulval varices: Secondary | ICD-10-CM | POA: Insufficient documentation

## 2017-10-27 DIAGNOSIS — R102 Pelvic and perineal pain: Secondary | ICD-10-CM

## 2017-10-27 DIAGNOSIS — N9489 Other specified conditions associated with female genital organs and menstrual cycle: Secondary | ICD-10-CM

## 2017-10-27 DIAGNOSIS — N939 Abnormal uterine and vaginal bleeding, unspecified: Secondary | ICD-10-CM

## 2017-10-27 DIAGNOSIS — R1024 Suprapubic pain: Secondary | ICD-10-CM

## 2017-10-27 HISTORY — PX: IR ANGIOGRAM SELECTIVE EACH ADDITIONAL VESSEL: IMG667

## 2017-10-27 HISTORY — PX: IR US GUIDE VASC ACCESS RIGHT: IMG2390

## 2017-10-27 HISTORY — PX: IR EMBO VENOUS NOT HEMORR HEMANG  INC GUIDE ROADMAPPING: IMG5447

## 2017-10-27 HISTORY — PX: IR VENOGRAM RENAL UNI RIGHT: IMG681

## 2017-10-27 LAB — CBC
HEMATOCRIT: 38.9 % (ref 36.0–46.0)
Hemoglobin: 13 g/dL (ref 12.0–15.0)
MCH: 30.7 pg (ref 26.0–34.0)
MCHC: 33.4 g/dL (ref 30.0–36.0)
MCV: 92 fL (ref 78.0–100.0)
PLATELETS: 230 10*3/uL (ref 150–400)
RBC: 4.23 MIL/uL (ref 3.87–5.11)
RDW: 11.5 % (ref 11.5–15.5)
WBC: 6.8 10*3/uL (ref 4.0–10.5)

## 2017-10-27 LAB — APTT: aPTT: 31 seconds (ref 24–36)

## 2017-10-27 LAB — PROTIME-INR
INR: 0.9
Prothrombin Time: 12 seconds (ref 11.4–15.2)

## 2017-10-27 LAB — HCG, SERUM, QUALITATIVE: Preg, Serum: NEGATIVE

## 2017-10-27 MED ORDER — HYDROCODONE-ACETAMINOPHEN 5-325 MG PO TABS: 1.0000 | ORAL_TABLET | ORAL | Status: DC | PRN

## 2017-10-27 MED ORDER — CEFAZOLIN SODIUM-DEXTROSE 2-4 GM/100ML-% IV SOLN: 2.0000 g | INTRAVENOUS | Status: DC

## 2017-10-27 MED ORDER — KETOROLAC TROMETHAMINE 30 MG/ML IJ SOLN
30.0000 mg | Freq: Once | INTRAMUSCULAR | Status: AC
Start: 2017-10-27 — End: 2017-10-27
  Administered 2017-10-27: 30 mg via INTRAVENOUS

## 2017-10-27 MED ORDER — MIDAZOLAM HCL 2 MG/2ML IJ SOLN
INTRAMUSCULAR | Status: AC | PRN
  Administered 2017-10-27 (×2): 1 mg via INTRAVENOUS
  Administered 2017-10-27 (×8): 0.5 mg via INTRAVENOUS

## 2017-10-27 MED ORDER — SODIUM CHLORIDE 0.9 % IV SOLN: INTRAVENOUS | Status: DC

## 2017-10-27 MED ORDER — HYDROMORPHONE HCL 2 MG/ML IJ SOLN
INTRAMUSCULAR | Status: AC
  Administered 2017-10-27: 1 mg via INTRAVENOUS
  Filled 2017-10-27: qty 1

## 2017-10-27 MED ORDER — LIDOCAINE HCL (PF) 1 % IJ SOLN
INTRAMUSCULAR | Status: AC | PRN
  Administered 2017-10-27: 5 mL

## 2017-10-27 MED ORDER — MIDAZOLAM HCL 2 MG/2ML IJ SOLN
INTRAMUSCULAR | Status: AC
  Filled 2017-10-27: qty 4

## 2017-10-27 MED ORDER — HYDROMORPHONE HCL 2 MG/ML IJ SOLN
1.0000 mg | INTRAMUSCULAR | Status: DC | PRN
  Administered 2017-10-27: 1 mg via INTRAVENOUS

## 2017-10-27 MED ORDER — ONDANSETRON HCL 4 MG/2ML IJ SOLN
INTRAMUSCULAR | Status: AC
  Administered 2017-10-27: 4 mg via INTRAVENOUS
  Filled 2017-10-27: qty 2

## 2017-10-27 MED ORDER — ONDANSETRON HCL 4 MG/2ML IJ SOLN
4.0000 mg | Freq: Once | INTRAMUSCULAR | Status: AC
  Administered 2017-10-27: 4 mg via INTRAVENOUS

## 2017-10-27 MED ORDER — LIDOCAINE HCL 1 % IJ SOLN
INTRAMUSCULAR | Status: AC
  Filled 2017-10-27: qty 20

## 2017-10-27 MED ORDER — KETOROLAC TROMETHAMINE 30 MG/ML IJ SOLN
30.0000 mg | Freq: Once | INTRAMUSCULAR | Status: AC
  Administered 2017-10-27: 30 mg via INTRAVENOUS
  Filled 2017-10-27: qty 1

## 2017-10-27 MED ORDER — FENTANYL CITRATE (PF) 100 MCG/2ML IJ SOLN
INTRAMUSCULAR | Status: AC | PRN
  Administered 2017-10-27 (×2): 25 ug via INTRAVENOUS
  Administered 2017-10-27: 12.5 ug via INTRAVENOUS
  Administered 2017-10-27: 25 ug via INTRAVENOUS
  Administered 2017-10-27: 12.5 ug via INTRAVENOUS
  Administered 2017-10-27 (×2): 25 ug via INTRAVENOUS

## 2017-10-27 MED ORDER — CEFAZOLIN SODIUM-DEXTROSE 2-4 GM/100ML-% IV SOLN
INTRAVENOUS | Status: AC
  Filled 2017-10-27: qty 100

## 2017-10-27 MED ORDER — KETOROLAC TROMETHAMINE 30 MG/ML IJ SOLN
INTRAMUSCULAR | Status: AC
  Filled 2017-10-27: qty 1

## 2017-10-27 MED ORDER — ONDANSETRON HCL 4 MG/2ML IJ SOLN
INTRAMUSCULAR | Status: AC
  Filled 2017-10-27: qty 2

## 2017-10-27 MED ORDER — SODIUM CHLORIDE 0.9 % IV SOLN
INTRAVENOUS | Status: DC
  Administered 2017-10-27: 14:00:00 via INTRAVENOUS

## 2017-10-27 MED ORDER — FENTANYL CITRATE (PF) 100 MCG/2ML IJ SOLN
INTRAMUSCULAR | Status: AC
  Filled 2017-10-27: qty 4

## 2017-10-27 MED ORDER — MIDAZOLAM HCL 2 MG/2ML IJ SOLN
INTRAMUSCULAR | Status: AC
Start: 2017-10-27 — End: 2017-10-27
  Filled 2017-10-27: qty 4

## 2017-10-27 MED ORDER — IOHEXOL 300 MG/ML  SOLN
150.0000 mL | Freq: Once | INTRAMUSCULAR | Status: AC | PRN
  Administered 2017-10-27: 200 mL via INTRAVENOUS

## 2017-10-27 MED ORDER — SODIUM CHLORIDE 0.9 % IV SOLN
INTRAVENOUS | Status: AC | PRN
  Administered 2017-10-27: 10 mL/h via INTRAVENOUS

## 2017-10-27 NOTE — Progress Notes (Addendum)
Pt ambulated to bathroom at 3:30, stated she felt very nauseous. Returning to bed, pt still had 8/10 headache, no more CP. Per Caryn BeeKevin, PA to give pt 30 mg of Toradol. Given at 1610. Will continue to monitor and update PA  Per Caryn BeeKevin, [place], pt to receive another dose of Zofran and pt may D/C home at 1700

## 2017-10-27 NOTE — H&P (Signed)
Chief Complaint: I'm here for my vein procedure  Referring Physician(s): Yamagata,Glenn  Supervising Physician: Irish Lack  Patient Status: [facility] - Out-pt  History of Present Illness: Debra Newton is a 32 y.o. female seen by Dr. Fredia Sorrow for prominent vulvar, pelvic floor, and labial varicosities. After her workup, she was determined to be a candidate for transcatheter therapy with balloon occlusion sclerotherapy and obliteration of varicosites and possible coil embolization after sclerotherapy to maximize long-term varicosity occlusion. She is scheduled for this procedure today PMHx, meds, labs, imaging, allergies reviewed. Feels well, no recent fevers, chills, illness. Has been NPO today as directed. Family at bedside.     Past Medical History:  Diagnosis Date  . Preeclampsia   . Varicose veins     Past Surgical History:  Procedure Laterality Date  . IR RADIOLOGIST EVAL & MGMT  09/21/2017    Allergies: Patient has no known allergies.  Medications: Prior to Admission medications   Medication Sig Start Date End Date Taking? Authorizing Provider  aspirin-acetaminophen-caffeine (EXCEDRIN MIGRAINE) [phone] MG tablet Take by mouth every 6 (six) hours as needed (for cramping).   Yes [provider]  Probiotic Product (ALIGN PO) Take 1 tablet by mouth daily.   Yes [provider]     Family History  Problem Relation Age of Onset  . Diabetes Other   . Hypertension Other   . Varicose Veins Mother     Social History   Socioeconomic History  . Marital status: Married    Spouse name: Not on file  . Number of children: Not on file  . Years of education: Not on file  . Highest education level: Not on file  Occupational History  . Not on file  Social Needs  . Financial resource strain: Not on file  . Food insecurity:    Worry: Not on file    Inability: Not on file  . Transportation needs:    Medical: Not on file   Non-medical: Not on file  Tobacco Use  . Smoking status: Never Smoker  . Smokeless tobacco: Never Used  Substance and Sexual Activity  . Alcohol use: No  . Drug use: No  . Sexual activity: Yes  Lifestyle  . Physical activity:    Days per week: Not on file    Minutes per session: Not on file  . Stress: Not on file  Relationships  . Social connections:    Talks on phone: Not on file    Gets together: Not on file    Attends religious service: Not on file    Active member of club or organization: Not on file    Attends meetings of clubs or organizations: Not on file    Relationship status: Not on file  Other Topics Concern  . Not on file  Social History Narrative  . Not on file     Review of Systems: A 12 point ROS discussed and pertinent positives are indicated in the HPI above.  All other systems are negative.  Review of Systems  Vital Signs: BP 115/72   Pulse 67   Temp 98.2 F (36.8 C) (Oral)   Resp 16   Ht 5\' 3"  (1.6 m)   Wt 167 lb (75.8 kg)   LMP 09/27/2017   SpO2 99%   BMI 29.58 kg/m   Physical Exam  Constitutional: She is oriented to person, place, and time. She appears well-developed. No distress.  HENT:  Head: Normocephalic.  Mouth/Throat: Oropharynx is clear and moist.  Neck: Normal range of motion. No JVD present.  Cardiovascular: Normal rate, regular rhythm, normal heart sounds and intact distal pulses.  Pulmonary/Chest: Effort normal and breath sounds normal. No respiratory distress.  Abdominal: Soft. She exhibits no distension and no mass. There is no tenderness.  Neurological: She is alert and oriented to person, place, and time.  Skin: Skin is warm and dry.  Psychiatric: She has a normal mood and affect. Judgment normal.    Imaging: No results found.  Labs:  CBC: Recent Labs    09/28/17 1628  WBC 7.8  HGB 12.6  HCT 38.4  PLT 233    COAGS: No results for input(s): INR, APTT in the last 8760 hours.  BMP: No results for input(s):  NA, K, CL, CO2, GLUCOSE, BUN, CALCIUM, CREATININE, GFRNONAA, GFRAA in the last 8760 hours.  Invalid input(s): CMP  LIVER FUNCTION TESTS: No results for input(s): BILITOT, AST, ALT, ALKPHOS, PROT, ALBUMIN in the last 8760 hours.  TUMOR MARKERS: No results for input(s): AFPTM, CEA, CA199, CHROMGRNA in the last 8760 hours.  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  Plan for transcatheter therapy with balloon occlusion sclerotherapy and obliteration of varicosites and possible coil embolization Labs pending Risks and benefits of the above procedure were discussed with the patient including, but not limited to bleeding, infection, vascular injury or contrast induced renal failure.  This interventional procedure involves the use of X-rays and because of the nature of the planned procedure, it is possible that we will have prolonged use of X-ray fluoroscopy.  Potential radiation risks to you include (but are not limited to) the following: - A slightly elevated risk for cancer  several years later in life. This risk is typically less than 0.5% percent. This risk is low in comparison to the normal incidence of human cancer, which is 33% for women and 50% for men according to the [employer] Society. - Radiation induced injury can include skin redness, resembling a rash, tissue breakdown / ulcers and hair loss (which can be temporary or permanent).   The likelihood of either of these occurring depends on the difficulty of the procedure and whether you are sensitive to radiation due to previous procedures, disease, or genetic conditions.   IF your procedure requires a prolonged use of radiation, you will be notified and given written instructions for further action.  It is your responsibility to monitor the irradiated area for the 2 weeks following the procedure and to  notify your physician if you are concerned that you have suffered a radiation induced injury.    All of the patient's questions were answered, patient is agreeable to proceed.  Consent signed and in chart.      Thank you for this interesting consult.  I greatly enjoyed meeting Debra Newton and look forward to participating in their care.  A copy of this report was sent to the requesting provider on this date.  Electronically Signed: Brayton ElBRUNING, Ilma Achee, PA-C 10/27/2017, 7:19 AM   I spent a total of 20 minutes in face to face in clinical consultation, greater than 50% of which was counseling/coordinating care for pelvic/ovarian venogram

## 2017-10-27 NOTE — Procedures (Signed)
Interventional Radiology Procedure Note  Procedure: Bilateral gonadal venography with embolization  Complications: Mild amount of venous hemorrhage at level of left gonadal vein  Estimated Blood Loss: < 10 mL  Findings: Varicosities of vulva, perineum and labia treated with sclerotherapy via right gonadal vein and coil embolization of bilateral gonadal vein branches.  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  Jodi MarbleGlenn T. Fredia SorrowYamagata, M.D Pager:  [phone]

## 2017-10-27 NOTE — Discharge Instructions (Addendum)
Embolizacin, cuidados posteriores (Embolization, Care After) Siga estas instrucciones durante las prximas semanas. Estas indicaciones le proporcionan informacin general acerca de cmo deber cuidarse despus del procedimiento. El mdico tambin podr darle instrucciones ms especficas. El tratamiento ha sido planificado segn las prcticas mdicas actuales, pero en algunos casos pueden ocurrir problemas. Comunquese con el mdico si tiene algn problema o tiene dudas despus del procedimiento. QU ESPERAR DESPUS DEL PROCEDIMIENTO Despus del procedimiento, es normal tener los siguientes sntomas:  Dolor, hinchazn o hematomas en el lugar de la puncin.  Dolor de [place].  Prdida del apetito, nuseas o vmitos.  Grant Ruts. INSTRUCCIONES PARA EL CUIDADO EN EL HOGAR  Tome todos los medicamentos como le indic el mdico.  Puede seguir su dieta habitual, pero asegrese de incluir grandes cantidades de frutas, vegetales y cereales integrales. Esto ayuda a Loss adjuster, chartered.  Beba suficiente lquido para Photographer orina clara o de color amarillo plido.  [employer] caminatas de todos [place].  No levante nada que pese ms de 5kg durante una semana despus del procedimiento.  No conduzca durante 24horas despus del procedimiento.  No tome baos durante 5das despus del procedimiento. Puede ducharse.  Es posible que pueda retomar todas sus actividades normales en el trmino de una semana despus del procedimiento. Pregunte a su mdico cundo podr volver a Veterinary surgeon sexual.  Cumpla con todas las visitas de control.  SOLICITE ATENCIN MDICA SI:  El medicamento no IT trainer.  Tiene fiebre.  Tiene nuseas o vmitos.  Tiene nuevos hematomas o hinchazn en la ingle.  SOLICITE ATENCIN MDICA DE INMEDIATO SI:  Tiene fiebre o sntomas que persisten durante ms de 3 das.  Siente dolor en las piernas.  Observa hinchazn o cambio de color en las  piernas.  La piel de las piernas se vuelve plida, fra o de color azulado.  Experimenta falta de aire, se siente dbil o se desmaya.  Tiene dolor en el pecho o dificultad para respirar.  Tiene tos o elimina sangre al toser.  Le aparece una erupcin cutnea.  Los [employer] causan [employer].  Se siente dbil o tiene problemas para usar los brazos o las piernas.  Tiene problemas de equilibrio.  Tiene problemas para hablar o visuales.  Esta informacin no tiene Theme park manager el consejo del mdico. Asegrese de hacerle al mdico cualquier pregunta que tenga. Document Released: 05/21/2013 Document Revised: 05/21/2013 Document Reviewed: 04/16/2013 Elsevier Interactive Patient Education  2017 Elsevier Inc.  Sedacin consciente moderada en los adultos, cuidados posteriores (Moderate Conscious Sedation, Adult, Care After) Estas indicaciones le proporcionan informacin acerca de cmo deber cuidarse despus del procedimiento. El mdico tambin podr darle instrucciones ms especficas. El tratamiento ha sido planificado segn las prcticas mdicas actuales, pero en algunos casos pueden ocurrir problemas. Comunquese con el mdico si tiene algn problema o dudas despus del procedimiento. QU ESPERAR DESPUS DEL PROCEDIMIENTO Despus del procedimiento, es comn:  Sentirse somnoliento durante varias horas.  Sentirse torpe y [employer] de equilibrio durante varias horas.  Perder el sentido de la realidad durante varias horas.  Vomitar si come [employer]. INSTRUCCIONES PARA EL CUIDADO EN EL HOGAR Durante al menos 24horas despus del procedimiento:  No haga lo siguiente: ? Participar en actividades que impliquen posibles cadas o lesiones. ? Conducir vehculos. ? Operar maquinarias pesadas. ? Beber alcohol. ? Tomar somnferos o medicamentos que causen somnolencia. ? Firmar documentos legales ni tomar [employer]. ? Cuidar a nios por su  cuenta.  Hacer  reposo. Comida y bebida  Siga la dieta recomendada por el mdico.  Si vomita: ? Pruebe agua, jugo o sopa cuando usted pueda beber sin vomitar. ? Asegrese de no tener nuseas antes de ingerir alimentos slidos. Instrucciones generales  Permanezca con un adulto responsable hasta que est completamente despierto y consciente.  Tome los medicamentos de venta libre y los recetados solamente como se lo haya indicado el mdico.  Si fuma, no lo haga sin supervisin.  Concurra a todas las visitas de control como se lo haya indicado el mdico. Esto es importante. SOLICITE ATENCIN MDICA SI:  Sigue teniendo nuseas o vomitando.  Tiene sensacin de desvanecimiento.  Le aparece una erupcin cutnea.  Tiene fiebre. SOLICITE ATENCIN MDICA DE INMEDIATO SI:  Tiene dificultad para respirar. Esta informacin no tiene Theme park manager el consejo del mdico. Asegrese de hacerle al mdico cualquier pregunta que tenga. Document Released: 05/21/2013 Document Revised: 06/06/2014 Document Reviewed: 09/05/2015 Elsevier Interactive Patient Education  2018 [employer].  Cuidado del sitio femoral (Femoral Site Care) Siga estas instrucciones durante las prximas semanas. Estas indicaciones le proporcionan informacin acerca de cmo deber cuidarse despus del procedimiento. El mdico tambin podr darle instrucciones ms especficas. El tratamiento ha sido planificado segn las prcticas mdicas actuales, pero en algunos casos pueden ocurrir problemas. Comunquese con el mdico si tiene algn problema o tiene dudas despus del procedimiento. QU ESPERAR DESPUS DEL PROCEDIMIENTO Despus del procedimiento, es normal tener lo siguiente:  Hematomas en el sitio que suelen desaparecer en el trmino de 1 o 2semanas.  Acumulacin de sangre en el tejido (hematoma) que puede ser dolorosa al tacto. Generalmente, en 1 o 2 semanas deben disminuir su tamao y el dolor a la  palpacin. INSTRUCCIONES PARA EL CUIDADO EN EL HOGAR  Tome los medicamentos solamente como se lo haya indicado el mdico.  Puede ducharse 24a 48horas despus del procedimiento o como se lo haya indicado el mdico. Retire el vendaje (apsito) y limpie suavemente el lugar de la insercin con agua y jabn comn. Seque bien el rea con una toalla limpia dando golpecitos. No frote el lugar, ya que Transport planner.  No tome baos de inmersin, no nade ni use el jacuzzi hasta que el mdico lo autorice.  Revise diariamente el lugar de la insercin para ver si aparece enrojecimiento, hinchazn o secrecin.  No se aplique talcos ni lociones en el lugar.  Limite el uso de las escaleras a dos veces por da [employer] primeros 2 o 3das o como se lo haya indicado el mdico.  No se ponga en cuclillas durante los primeros 2 o 3das o como se lo haya indicado el mdico.  No levante objetos que pesen ms de 10libras (4,5kg) durante los 5das posteriores al procedimiento o como se lo haya indicado el mdico.  Pregntele al mdico cundo puede hacer lo siguiente: ? Regresar a la escuela o al Aleen Campi. ? Reanudar las actividades fsicas o los deportes que practica habitualmente. ? Reanudar la actividad sexual.  No conduzca el automvil por sus propios medios si le dan el alta el mismo da del procedimiento. Pdale a otra persona que lo lleve.  Puede conducir 24horas despus del procedimiento, a menos que el mdico le haya indicado lo contrario.  No opere maquinaria ni herramientas elctricas durante 24horas despus del procedimiento o como lo haya indicado el mdico.  Si el procedimiento se hizo de Theme park manager, lo que significa que regres a su casa el mismo da de su realizacin, un adulto responsable  debe acompaarlo durante las primeras 24horas.  Concurra a todas las visitas de control como se lo haya indicado el mdico. Esto es importante. SOLICITE ATENCIN MDICA SI:  Lance Muss.  Tiene escalofros.  Aumenta el sangrado en el sitio. Haga presin Medical laboratory scientific officer. SOLICITE ATENCIN MDICA DE INMEDIATO SI:  Tiene un dolor que no es habitual en el sitio.  Observa que el sitio est enrojecida, caliente o hinchada.  Tiene secrecin (que no es una pequea cantidad de sangre en el vendaje) en el sitio.  El sitio est sangrando, y el sangrado no se detiene despus de de Occupational hygienist una presin constante.  La pierna o el pie se le ponen plidos, fros o siente hormigueo o adormecimiento. Esta informacin no tiene Theme park manager el consejo del mdico. Asegrese de hacerle al mdico cualquier pregunta que tenga. Document Released: 02/28/2014 Document Revised: 09/07/2015 Document Reviewed: 12/03/2013 Elsevier Interactive Patient Education  2018 [employer].

## 2017-10-27 NOTE — Progress Notes (Signed)
**Note Celester Lech-Identified via Obfuscation** Patient post bilateral gonadal venography with embolization. Patient complained of nausea and pain to head, neck, and groin area. Patient received 4 mg of Zofran x1 and  dilaudid x1 per order. Patient complained of dizziness, chest tightness and increased headache post administration of dilaudid. EKG completed and results showed NSR. Vital signs stable (see flowsheet). No respiratory distress noted. Patient confirmed that dilaudid helped with pain to procedural sites; however, she continues to experience headache. Brayton El, PA notified of patient's complaints and findings. Fall/safety precautions implemented.

## 2017-10-29 ENCOUNTER — Emergency Department (HOSPITAL_COMMUNITY)
Admission: EM | Admit: 2017-10-29 | Discharge: 2017-10-30 | Disposition: A | Discharge status: Home / Self Care | Payer: Self-pay | Attending: Emergency Medicine | Admitting: Emergency Medicine

## 2017-10-29 DIAGNOSIS — R55 Syncope and collapse: Secondary | ICD-10-CM

## 2017-10-29 DIAGNOSIS — R202 Paresthesia of skin: Secondary | ICD-10-CM

## 2017-10-29 DIAGNOSIS — Z7982 Long term (current) use of aspirin: Secondary | ICD-10-CM | POA: Insufficient documentation

## 2017-10-29 DIAGNOSIS — Z79899 Other long term (current) drug therapy: Secondary | ICD-10-CM | POA: Insufficient documentation

## 2017-10-29 DIAGNOSIS — M79602 Pain in left arm: Secondary | ICD-10-CM

## 2017-10-29 DIAGNOSIS — R519 Headache, unspecified: Secondary | ICD-10-CM

## 2017-10-29 DIAGNOSIS — M79601 Pain in right arm: Secondary | ICD-10-CM

## 2017-10-29 DIAGNOSIS — R51 Headache: Secondary | ICD-10-CM | POA: Insufficient documentation

## 2017-10-29 LAB — CBG MONITORING, ED: Glucose-Capillary: 96 mg/dL (ref 65–99)

## 2017-10-29 LAB — I-STAT BETA HCG BLOOD, ED (MC, WL, AP ONLY): I-stat hCG, quantitative: <5 m[IU]/mL (ref <5)

## 2017-10-29 MED ORDER — FENTANYL CITRATE (PF) 100 MCG/2ML IJ SOLN
50.0000 ug | Freq: Once | INTRAMUSCULAR | Status: AC
  Administered 2017-10-29: 50 ug via INTRAVENOUS
  Filled 2017-10-29: qty 2

## 2017-10-29 NOTE — ED Triage Notes (Signed)
Pt BIB [facility] for near syncopal episode. Pt states she was here and had a gonadal vein embolization. Pt felt like she was going to pass out earlier and called ems. States better when she is laying down. EMS orthostatics were negative. Pt also c/o dizziness. Pt also reports a left sided headache

## 2017-10-30 ENCOUNTER — Emergency Department (HOSPITAL_COMMUNITY): Payer: Self-pay

## 2017-10-30 LAB — URINALYSIS, ROUTINE W REFLEX MICROSCOPIC
Bilirubin Urine: NEGATIVE
GLUCOSE, UA: NEGATIVE mg/dL
Ketones, ur: NEGATIVE mg/dL
Leukocytes, UA: NEGATIVE
Nitrite: NEGATIVE
PH: 6 (ref 5.0–8.0)
PROTEIN: NEGATIVE mg/dL
Specific Gravity, Urine: 1.003 — ABNORMAL LOW (ref 1.005–1.030)

## 2017-10-30 LAB — COMPREHENSIVE METABOLIC PANEL
ALBUMIN: 3.7 g/dL (ref 3.5–5.0)
ALK PHOS: 55 U/L (ref 38–126)
ALT: 13 U/L — ABNORMAL LOW (ref 14–54)
AST: 17 U/L (ref 15–41)
Anion gap: 10 (ref 5–15)
BUN: 13 mg/dL (ref 6–20)
CALCIUM: 9.2 mg/dL (ref 8.9–10.3)
CO2: 26 mmol/L (ref 22–32)
CREATININE: 0.82 mg/dL (ref 0.44–1.00)
Chloride: 100 mmol/L — ABNORMAL LOW (ref 101–111)
GFR calc Af Amer: >60 mL/min (ref >60)
GFR calc non Af Amer: >60 mL/min (ref >60)
GLUCOSE: 98 mg/dL (ref 65–99)
Potassium: 3.7 mmol/L (ref 3.5–5.1)
SODIUM: 136 mmol/L (ref 135–145)
Total Bilirubin: 0.6 mg/dL (ref 0.3–1.2)
Total Protein: 6.7 g/dL (ref 6.5–8.1)

## 2017-10-30 LAB — CBC WITH DIFFERENTIAL/PLATELET
ABS IMMATURE GRANULOCYTES: 0 10*3/uL (ref 0.0–0.1)
BASOS ABS: 0.1 10*3/uL (ref 0.0–0.1)
BASOS PCT: 1 %
Eosinophils Absolute: 0.6 10*3/uL (ref 0.0–0.7)
Eosinophils Relative: 6 %
HCT: 34.1 % — ABNORMAL LOW (ref 36.0–46.0)
Hemoglobin: 11.6 g/dL — ABNORMAL LOW (ref 12.0–15.0)
IMMATURE GRANULOCYTES: 0 %
Lymphocytes Relative: 31 %
Lymphs Abs: 2.9 10*3/uL (ref 0.7–4.0)
MCH: 31.2 pg (ref 26.0–34.0)
MCHC: 34 g/dL (ref 30.0–36.0)
MCV: 91.7 fL (ref 78.0–100.0)
MONOS PCT: 8 %
Monocytes Absolute: 0.7 10*3/uL (ref 0.1–1.0)
NEUTROS ABS: 5.1 10*3/uL (ref 1.7–7.7)
NEUTROS PCT: 54 %
PLATELETS: 186 10*3/uL (ref 150–400)
RBC: 3.72 MIL/uL — ABNORMAL LOW (ref 3.87–5.11)
RDW: 11.1 % — AB (ref 11.5–15.5)
WBC: 9.5 10*3/uL (ref 4.0–10.5)

## 2017-10-30 MED ORDER — DIPHENHYDRAMINE HCL 50 MG/ML IJ SOLN
25.0000 mg | Freq: Once | INTRAMUSCULAR | Status: AC
  Administered 2017-10-30: 25 mg via INTRAVENOUS
  Filled 2017-10-30: qty 1

## 2017-10-30 MED ORDER — IOPAMIDOL (ISOVUE-370) INJECTION 76%
INTRAVENOUS | Status: AC
  Administered 2017-10-30: 100 mL
  Filled 2017-10-30: qty 100

## 2017-10-30 MED ORDER — DEXAMETHASONE SODIUM PHOSPHATE 10 MG/ML IJ SOLN
10.0000 mg | Freq: Once | INTRAMUSCULAR | Status: AC
  Administered 2017-10-30: 10 mg via INTRAVENOUS
  Filled 2017-10-30: qty 1

## 2017-10-30 MED ORDER — SODIUM CHLORIDE 0.9 % IV BOLUS
1000.0000 mL | Freq: Once | INTRAVENOUS | Status: AC
Start: 2017-10-30 — End: 2017-10-30
  Administered 2017-10-30: 1000 mL via INTRAVENOUS

## 2017-10-30 MED ORDER — PROCHLORPERAZINE EDISYLATE 10 MG/2ML IJ SOLN
10.0000 mg | Freq: Once | INTRAMUSCULAR | Status: AC
Start: 2017-10-30 — End: 2017-10-30
  Administered 2017-10-30: 10 mg via INTRAVENOUS
  Filled 2017-10-30: qty 2

## 2017-10-30 MED ORDER — ONDANSETRON 4 MG PO TBDP: 4.0000 mg | ORAL_TABLET | Freq: Three times a day (TID) | ORAL | 0 refills | Status: AC | PRN

## 2017-10-30 NOTE — ED Provider Notes (Addendum)
[facility] EMERGENCY DEPARTMENT Provider Note  CSN: [id number] Arrival date & time: 10/29/17 2252  Chief Complaint(s) Near Syncope  HPI Debra Newton is a 32 y.o. female with a history of varicose veins and pelvic congestion syndrome status post recent embolization who presents to the emergency department with generalized headache, bilateral upper extremity and lower extremity paresthesias which began immediately following embolization 2 days ago.  Patient reports that her symptoms have been persistent.  Headache is new type of headache.  Exacerbated with light and loud sounds.  Mildly alleviated by Excedrin.  She endorses associated nausea and emesis which has improved.  She has had decreased oral intake following the procedure.  States that she had a little bit to eat today.  She was able to tolerate that.  She reports that the extremity paresthesias feels like numbness and tingling.  No alleviating or aggravating factors.  No weakness.  No chest pain or shortness of breath.  No abdominal pain.  Mild dysuria. Endorses suprapubic discomfort.  Patient also reports that she felt like she was going to pass out earlier today.  Complaining of lightheadedness.  EMS reports that orthostatics were reassuring. CBG WNL.    Post embolism RN note: Patient post bilateral gonadal venography with embolization. Patient complained of nausea and pain to head, neck, and groin area. Patient received 4 mg of Zofran x1 and 1mg  dilaudid x1 per order. Patient complained of dizziness, chest tightness and increased headache post administration of dilaudid. EKG completed and results showed NSR. Vital signs stable (see flowsheet). No respiratory distress noted. Patient confirmed that dilaudid helped with pain to procedural sites; however, she continues to experience headache. Brayton El, PA notified of patient's complaints and findings. Fall/safety precautions implemented.    HPI  Past  Medical History Past Medical History:  Diagnosis Date  . Preeclampsia   . Varicose veins    Patient Active Problem List   Diagnosis Date Noted  . 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 Authorizing Provider  aspirin-acetaminophen-caffeine (EXCEDRIN MIGRAINE) [phone] MG tablet Take by mouth every 6 (six) hours as needed (for cramping).   Yes [provider]  HYDROcodone-acetaminophen (NORCO/VICODIN) 5-325 MG tablet Take 1 tablet by mouth every 4 (four) hours as needed for moderate pain.  10/27/17  Yes [provider]  ibuprofen (ADVIL,MOTRIN) 800 MG tablet Take 800 mg by mouth 3 (three) times daily. 10/27/17  Yes [provider]  ondansetron (ZOFRAN) 4 MG tablet Take 4 mg by mouth every 4 (four) hours as needed for nausea or vomiting.  10/27/17  Yes [provider]  Probiotic Product (ALIGN PO) Take 1 tablet by mouth daily.   Yes [provider]  Past Surgical History Past Surgical History:  Procedure Laterality Date  . IR ANGIOGRAM SELECTIVE EACH ADDITIONAL VESSEL  10/27/2017  . IR ANGIOGRAM SELECTIVE EACH ADDITIONAL VESSEL  10/27/2017  . IR ANGIOGRAM SELECTIVE EACH ADDITIONAL VESSEL  10/27/2017  . IR ANGIOGRAM SELECTIVE EACH ADDITIONAL VESSEL  10/27/2017  . IR EMBO VENOUS NOT HEMORR HEMANG  INC GUIDE ROADMAPPING  10/27/2017  . IR RADIOLOGIST EVAL & MGMT  09/21/2017  . IR US GUIDE VASC ACCESS RIGHT  10/27/2017  . IR US GUIDE VASC ACCESS RIGHT  10/27/2017  . IR VENOGRAM RENAL UNI RIGHT  10/27/2017   Family History Family History  Problem Relation Age of Onset  . Diabetes Other   . Hypertension Other   . Varicose Veins Mother     Social History Social History   Tobacco Use  . Smoking status: Never Smoker  .  Smokeless tobacco: Never Used  Substance Use Topics  . Alcohol use: No  . Drug use: No   Allergies Patient has no known allergies.  Review of Systems Review of Systems All other systems are reviewed and are negative for acute change except as noted in the HPI  Physical Exam Vital Signs  I have reviewed the triage vital signs BP 118/73   Pulse 72   Resp 13   Ht 5\' 3"  (1.6 m)   Wt 75.8 kg (167 lb)   SpO2 97%   BMI 29.58 kg/m   Physical Exam  Constitutional: She is oriented to person, place, and time. She appears well-developed and well-nourished. No distress.  HENT:  Head: Normocephalic and atraumatic.  Nose: Nose normal.  Eyes: Pupils are equal, round, and reactive to light. Conjunctivae and EOM are normal. Right eye exhibits no discharge. Left eye exhibits no discharge. No scleral icterus.  Neck: Normal range of motion. Neck supple.  Cardiovascular: Normal rate and regular rhythm. Exam reveals no gallop and no friction rub.  No murmur heard. Pulmonary/Chest: Effort normal and breath sounds normal. No stridor. No respiratory distress. She has no rales.  Abdominal: Soft. She exhibits no distension. There is no tenderness.  Musculoskeletal: She exhibits no edema or tenderness.  Lower extremity varicosity  Neurological: She is alert and oriented to person, place, and time.  Mental Status:  Alert and oriented to person, place, and time.  Attention and concentration normal.  Speech clear.  Recent memory is intact  Cranial Nerves:  II Visual Fields: Intact to confrontation. Visual fields intact. III, IV, VI: Pupils equal and reactive to light and near. Full eye movement without nystagmus  V Facial Sensation: Normal. No weakness of masticatory muscles  VII: No facial weakness or asymmetry  VIII Auditory Acuity: Grossly normal  IX/X: The uvula is midline; the palate elevates symmetrically  XI: Normal sternocleidomastoid and trapezius strength  XII: The tongue is midline. No  atrophy or fasciculations.   Motor System: Muscle Strength: 5/5 and symmetric in the upper and lower extremities. No pronation or drift.  Muscle Tone: Tone and muscle bulk are normal in the upper and lower extremities.   Reflexes: DTRs: 1+ and symmetrical in all four extremities. No Clonus Coordination:  No tremor.  Sensation: Intact to light touch, and pinprick.  Gait: Routine and tandem gait normal.   Skin: Skin is warm and dry. No rash noted. She is not diaphoretic. No erythema.  Psychiatric: She has a normal mood and affect.  Vitals reviewed.   ED Results and Treatments Labs (all labs ordered are listed, but only abnormal results  are displayed) Labs Reviewed  CBC WITH DIFFERENTIAL/PLATELET - Abnormal; Notable for the following components:      Result Value   RBC 3.72 (*)    Hemoglobin 11.6 (*)    HCT 34.1 (*)    RDW 11.1 (*)    All other components within normal limits  COMPREHENSIVE METABOLIC PANEL - Abnormal; Notable for the following components:   Chloride 100 (*)    ALT 13 (*)    All other components within normal limits  URINALYSIS, ROUTINE W REFLEX MICROSCOPIC - Abnormal; Notable for the following components:   Color, Urine COLORLESS (*)    Specific Gravity, Urine 1.003 (*)    Hgb urine dipstick SMALL (*)    Bacteria, UA RARE (*)    All other components within normal limits  CBG MONITORING, ED  I-STAT BETA HCG BLOOD, ED ([facility], [facility], [facility] ONLY)                                                                                                                         EKG  EKG Interpretation  Date/Time:  Sunday October 29 2017 23:00:57 EDT Ventricular Rate:  77 PR Interval:    QRS Duration: 90 QT Interval:  336 QTC Calculation: 381 R Axis:   66 Text Interpretation:  Sinus rhythm No significant change since last tracing Confirmed by Drema Pryardama, Donata Reddick [phone]([id number]) on 10/30/2017 12:24:08 AM      Radiology Ct Head Wo Contrast  Result Date: 10/30/2017 CLINICAL DATA:  New headache.  Status post pelvic vein endovascular embolization Oct 27, 2017. EXAM: CT HEAD WITHOUT CONTRAST TECHNIQUE: Contiguous axial images were obtained from the base of the skull through the vertex without intravenous contrast. Please note, examination was done immediately after contrast-enhanced CT of discontinuous body parts. COMPARISON:  None. FINDINGS: BRAIN: No intraparenchymal hemorrhage, mass effect nor midline shift. The ventricles and sulci are normal. No acute large vascular territory infarcts. No abnormal extra-axial fluid collections. Basal cisterns are patent. No abnormal intracranial enhancement. VASCULAR: Unremarkable.  Patent dural venous sinuses. SKULL/SOFT TISSUES: No skull fracture. No significant soft tissue swelling. ORBITS/SINUSES: The included ocular globes and orbital contents are normal.The mastoid aircells and included paranasal sinuses are well-aerated. OTHER: None. IMPRESSION: Normal CT HEAD. Electronically Signed   By: Awilda Metroourtnay  Bloomer M.D.   On: 10/30/2017 02:37   Ct Angio Chest Pe W And/or Wo Contrast  Result Date: 10/30/2017 CLINICAL DATA:  Recent pelvic pain embolization for pelvic congestion syndrome. Question of right femoral vein pseudoaneurysm or hematoma. New onset of extremity paresthesias. EXAM: CT ANGIOGRAPHY CHEST CT VENOGRAM ABDOMEN AND PELVIS TECHNIQUE: Multidetector CT imaging of the chest was performed using the standard protocol during bolus administration of intravenous contrast. Multiplanar CT image reconstructions and MIPs were obtained to evaluate the vascular anatomy. Multidetector CT imaging of the abdomen and pelvis was performed using the standard protocol after bolus administration of intravenous contrast. CONTRAST:  100mL ISOVUE-370 IOPAMIDOL (ISOVUE-370) INJECTION 76% COMPARISON:  Chest radiograph and CT of the  abdomen and pelvis performed 01/10/2011 FINDINGS: CTA CHEST FINDINGS Cardiovascular:  There is no evidence of pulmonary embolus. The heart is normal in  size. The thoracic aorta is grossly unremarkable, though not well assessed given the phase of contrast enhancement. Mediastinum/Nodes: The mediastinum is unremarkable. No mediastinal lymphadenopathy is seen. No pericardial effusion is identified. The visualized portions of the thyroid gland are unremarkable. No axillary lymphadenopathy is seen. Lungs/Pleura: Mild bibasilar atelectasis is noted. No pleural effusion or pneumothorax is seen. No masses are identified. Musculoskeletal: No acute osseous abnormalities are identified. The visualized musculature is unremarkable in appearance. Review of the MIP images confirms the above findings. CT VENOGRAM ABDOMEN and PELVIS FINDINGS Hepatobiliary: The liver is unremarkable in appearance. The gallbladder is unremarkable in appearance. The common bile duct remains normal in caliber. Pancreas: The pancreas is within normal limits. Spleen: The spleen is unremarkable in appearance. Adrenals/Urinary Tract: The adrenal glands are unremarkable in appearance. The kidneys are within normal limits. There is no evidence of hydronephrosis. No renal or ureteral stones are identified. No perinephric stranding is seen. Stomach/Bowel: The stomach is unremarkable in appearance. The small bowel is within normal limits. The appendix is normal in caliber, without evidence of appendicitis. The colon is unremarkable in appearance. Vascular/Lymphatic: The abdominal aorta is unremarkable in appearance. The inferior vena cava is grossly unremarkable. No retroperitoneal lymphadenopathy is seen. No pelvic sidewall lymphadenopathy is identified. There is no evidence of pseudoaneurysm or hematoma at the right femoral region. Prominent collateral vasculature is noted at the inguinal regions bilaterally, particularly on the right. Reproductive: The patient is status post embolization of the ovarian veins bilaterally, and the right periuterine veins. The uterus is grossly unremarkable in appearance,  though difficult to fully assess on CT. The ovaries are relatively symmetric. No suspicious adnexal masses are seen. Trace free fluid within the pelvis is likely physiologic in nature. Other: No additional soft tissue abnormalities are seen. Musculoskeletal: No acute osseous abnormalities are identified. The visualized musculature is unremarkable in appearance. Review of the MIP images confirms the above findings. IMPRESSION: 1. No evidence of pulmonary embolus. 2. Thoracic and abdominal aorta are grossly unremarkable in appearance, though the thoracic aorta is not well characterized given the phase of contrast enhancement. 3. No evidence of pseudoaneurysm or hematoma at the right femoral region. Prominent collateral vasculature at the inguinal regions bilaterally, particularly on the right. 4. Status post embolization of ovarian veins bilaterally, and the right periuterine veins. 5. Mild bibasilar atelectasis.  Lungs otherwise clear. Electronically Signed   By: Roanna Raider M.D.   On: 10/30/2017 02:59   Ct Venogram Abd/pel  Result Date: 10/30/2017 CLINICAL DATA:  Recent pelvic pain embolization for pelvic congestion syndrome. Question of right femoral vein pseudoaneurysm or hematoma. New onset of extremity paresthesias. EXAM: CT ANGIOGRAPHY CHEST CT VENOGRAM ABDOMEN AND PELVIS TECHNIQUE: Multidetector CT imaging of the chest was performed using the standard protocol during bolus administration of intravenous contrast. Multiplanar CT image reconstructions and MIPs were obtained to evaluate the vascular anatomy. Multidetector CT imaging of the abdomen and pelvis was performed using the standard protocol after bolus administration of intravenous contrast. CONTRAST:  ISOVUE-370 IOPAMIDOL (ISOVUE-370) INJECTION 76% COMPARISON:  Chest radiograph and CT of the abdomen and pelvis performed 01/10/2011 FINDINGS: CTA CHEST FINDINGS Cardiovascular:  There is no evidence of pulmonary embolus. The heart is normal in  size. The thoracic aorta is grossly unremarkable, though not well assessed given the phase of contrast enhancement. Mediastinum/Nodes: The mediastinum is unremarkable. No mediastinal  lymphadenopathy is seen. No pericardial effusion is identified. The visualized portions of the thyroid gland are unremarkable. No axillary lymphadenopathy is seen. Lungs/Pleura: Mild bibasilar atelectasis is noted. No pleural effusion or pneumothorax is seen. No masses are identified. Musculoskeletal: No acute osseous abnormalities are identified. The visualized musculature is unremarkable in appearance. Review of the MIP images confirms the above findings. CT VENOGRAM ABDOMEN and PELVIS FINDINGS Hepatobiliary: The liver is unremarkable in appearance. The gallbladder is unremarkable in appearance. The common bile duct remains normal in caliber. Pancreas: The pancreas is within normal limits. Spleen: The spleen is unremarkable in appearance. Adrenals/Urinary Tract: The adrenal glands are unremarkable in appearance. The kidneys are within normal limits. There is no evidence of hydronephrosis. No renal or ureteral stones are identified. No perinephric stranding is seen. Stomach/Bowel: The stomach is unremarkable in appearance. The small bowel is within normal limits. The appendix is normal in caliber, without evidence of appendicitis. The colon is unremarkable in appearance. Vascular/Lymphatic: The abdominal aorta is unremarkable in appearance. The inferior vena cava is grossly unremarkable. No retroperitoneal lymphadenopathy is seen. No pelvic sidewall lymphadenopathy is identified. There is no evidence of pseudoaneurysm or hematoma at the right femoral region. Prominent collateral vasculature is noted at the inguinal regions bilaterally, particularly on the right. Reproductive: The patient is status post embolization of the ovarian veins bilaterally, and the right periuterine veins. The uterus is grossly unremarkable in appearance,  though difficult to fully assess on CT. The ovaries are relatively symmetric. No suspicious adnexal masses are seen. Trace free fluid within the pelvis is likely physiologic in nature. Other: No additional soft tissue abnormalities are seen. Musculoskeletal: No acute osseous abnormalities are identified. The visualized musculature is unremarkable in appearance. Review of the MIP images confirms the above findings. IMPRESSION: 1. No evidence of pulmonary embolus. 2. Thoracic and abdominal aorta are grossly unremarkable in appearance, though the thoracic aorta is not well characterized given the phase of contrast enhancement. 3. No evidence of pseudoaneurysm or hematoma at the right femoral region. Prominent collateral vasculature at the inguinal regions bilaterally, particularly on the right. 4. Status post embolization of ovarian veins bilaterally, and the right periuterine veins. 5. Mild bibasilar atelectasis.  Lungs otherwise clear. Electronically Signed   By: Roanna Raider M.D.   On: 10/30/2017 02:59   Pertinent labs & imaging results that were available during my care of the patient were reviewed by me and considered in my medical decision making (see chart for details).  Medications Ordered in ED Medications  sodium chloride 0.9 % bolus 1,000 mL (1,000 mLs Intravenous New Bag/Given 10/30/17 0301)  fentaNYL (SUBLIMAZE) injection 50 mcg (50 mcg Intravenous Given 10/29/17 2345)  iopamidol (ISOVUE-370) 76 % injection (100 mLs  Contrast Given 10/30/17 0157)  diphenhydrAMINE (BENADRYL) injection 25 mg (25 mg Intravenous Given 10/30/17 0301)  prochlorperazine (COMPAZINE) injection 10 mg (10 mg Intravenous Given 10/30/17 0301)  dexamethasone (DECADRON) injection 10 mg (10 mg Intravenous Given 10/30/17 0301)  Procedures Procedures  (including critical care time)  Medical Decision  Making / ED Course I have reviewed the nursing notes for this encounter and the patient's prior records (if available in EHR or on provided paperwork).    Patient here with near syncopal episode in the setting of recent embolization for pelvic congestion syndrome.  Patient with decreased oral intake since procedure 2 days ago.  Also has associated chest pain and bilateral upper and lower extremity paresthesias.  Nonfocal exam.  CT head negative.  CT PE and venogram of abdomen and pelvis obtained without evidence of thrombosis.  Labs grossly reassuring with only mild hemoglobin drop.  No significant electrolyte derangements or renal insufficiency.  UA without evidence of infection.  Patient treated with IV fluids and migraine cocktail.  Headache with some improvement.  Etiology of paresthesia undetermined. Doubt CVA. No evidence of DVT. Unlikely cervical stenosis as she was not intubated for procedure.   Headache and nausea likely from sedation side effect vs dehydration from poor oral intake.  She was able to tolerate oral hydration.   Recommended close monitoring with appropriate hydration and intake.  Will prescribe antiemetic.  The patient appears reasonably screened and/or stabilized for discharge and I doubt any other medical condition or other [facility] requiring further screening, evaluation, or treatment in the ED at this time prior to discharge.  The patient is safe for discharge with strict return precautions.   Final Clinical Impression(s) / ED Diagnoses Final diagnoses:  Near syncope  Paresthesia and pain of both upper extremities  Paresthesia of both lower extremities  Bad headache   Disposition: Discharge  Condition: Good  I have discussed the results, Dx and Tx plan with the patient and family who expressed understanding and agree(s) with the plan. Discharge instructions discussed at great length. The patient and family were given strict return precautions who verbalized  understanding of the instructions. No further questions at time of discharge.    ED Discharge Orders        Ordered    ondansetron (ZOFRAN ODT) 4 MG disintegrating tablet  Every 8 hours PRN     10/30/17 0404       Follow Up: Primary care provider   If you do not have a primary care physician, contact [facility] at [phone] for referral      This chart was dictated using voice recognition software.  Despite best efforts to proofread,  errors can occur which can change the documentation meaning.       Nira Conn, MD 10/30/17 [phone]

## 2017-10-30 NOTE — ED Notes (Signed)
This tech explained to pt what orthostatic vitals was and pt stats she does not feel like doing them. RN Susy FrizzleMatt informed

## 2017-10-30 NOTE — ED Notes (Signed)
Patient transported to CT 

## 2017-10-30 NOTE — ED Notes (Signed)
Provider notified of patient refusing orthostatic vitals

## 2017-10-30 NOTE — ED Notes (Signed)
ED Provider at bedside. 

## 2017-10-30 NOTE — ED Notes (Signed)
Pt and family understood dc material. NAD noted. Script given at dc 

## 2017-10-31 ENCOUNTER — Telehealth: Payer: Self-pay | Admitting: Student

## 2017-10-31 ENCOUNTER — Telehealth: Payer: Self-pay | Admitting: Diagnostic Radiology

## 2017-10-31 ENCOUNTER — Other Ambulatory Visit: Payer: Self-pay | Admitting: Student

## 2017-10-31 ENCOUNTER — Telehealth: Payer: Self-pay

## 2017-10-31 NOTE — Telephone Encounter (Signed)
PA spoke with patient regarding medication changes.  Prescription for prednisone 60 mg taper dose pack [address] [facility] [address]  Answered all questions.   Loyce DysKacie Jaelani Posa, MS RD PA-C 4:08 PM

## 2017-10-31 NOTE — Telephone Encounter (Signed)
Patient called regarding her lab results. Informed patient of results.

## 2017-10-31 NOTE — Progress Notes (Signed)
Patient called about persistent abdominal and pelvic pain that is not improving with the pain medications.  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  Treatment consisted of gonadal and pelvic vein embolization with sclerotherapy and coil embolization.  The patient was seen in the emergency department yesterday for headache and paresthesias in her fingers.  Now the patient is complaining of severe intermittent pain in her pelvic area.  She says the pain is involving her uterus and ovaries.  Patient is not tolerating the oral ibuprofen due to stomach problems.  She is concerned about taking too much of the pain medication.  Difficult to explain all of the patient's symptoms over the last 1 to 2 days based on her recent embolization procedure.  However, I do think that she could be having inflammation and phlebitis in the pelvis from the recent embolization which would explain the intermittent severe pelvic pain.  Because she is not tolerating ibuprofen well, we will switch her to a prednisone Dosepak over 6 days.  Instructed her to stop taking the oral ibuprofen.  I told her that she can continue to take the Vicodin.  Told her that she can take a Vicodin 1 pill every 4 hours or 2 pills every 8 hours.  I also told her that she should stop taking the Excedrin while she is on the Vicodin.  IR nurse will follow-up with the patient in a few days to see how she is doing with this new change in pain and inflammatory management.

## 2017-11-07 ENCOUNTER — Other Ambulatory Visit (HOSPITAL_COMMUNITY): Payer: Self-pay | Admitting: Interventional Radiology

## 2017-11-07 DIAGNOSIS — I863 Vulval varices: Secondary | ICD-10-CM

## 2018-03-28 ENCOUNTER — Ambulatory Visit
Admission: RE | Admit: 2018-03-28 | Discharge: 2018-03-28 | Disposition: A | Discharge status: Home / Self Care | Payer: Self-pay | Source: Ambulatory Visit | Attending: Interventional Radiology | Admitting: Interventional Radiology

## 2018-03-28 ENCOUNTER — Other Ambulatory Visit (HOSPITAL_COMMUNITY): Payer: Self-pay | Admitting: Interventional Radiology

## 2018-03-28 ENCOUNTER — Encounter: Payer: Self-pay | Admitting: *Deleted

## 2018-03-28 DIAGNOSIS — I863 Vulval varices: Secondary | ICD-10-CM

## 2018-03-28 HISTORY — PX: IR RADIOLOGIST EVAL & MGMT: IMG5224

## 2018-03-28 NOTE — Progress Notes (Signed)
Chief Complaint: Symptomatic vulvar and labial varicosities and status post prior transcatheter embolization on 10/27/2017.  History of Present Illness: Debra Newton is a 32 y.o. female with a history of pelvic venous insufficiency and symptomatic vulvar and labial varicosities treated on 10/27/2017 with coil embolization of venous outflow in the right internal iliac vein followed by sclerotherapy in the right gonadal vein and deployment of vascular plugs in both right and left gonadal veins.  She initially did not return for follow-up.  She returns today with complaints of similar aching and pressure in the labial and vulvar regions as well as worsening labial skin discoloration.  She feels that the embolization procedure has not resulted in significant symptomatic improvement over the last 5 months.  She denies any new complaints.  She has not had any ulcerations, abnormal bleeding or fever.  Past Medical History:  Diagnosis Date  . Preeclampsia   . Varicose veins     Past Surgical History:  Procedure Laterality Date  . IR ANGIOGRAM SELECTIVE EACH ADDITIONAL VESSEL  10/27/2017  . IR ANGIOGRAM SELECTIVE EACH ADDITIONAL VESSEL  10/27/2017  . IR ANGIOGRAM SELECTIVE EACH ADDITIONAL VESSEL  10/27/2017  . IR ANGIOGRAM SELECTIVE EACH ADDITIONAL VESSEL  10/27/2017  . IR EMBO VENOUS NOT HEMORR HEMANG  INC GUIDE ROADMAPPING  10/27/2017  . IR RADIOLOGIST EVAL & MGMT  09/21/2017  . IR RADIOLOGIST EVAL & MGMT  03/28/2018  . IR US GUIDE VASC ACCESS RIGHT  10/27/2017  . IR US GUIDE VASC ACCESS RIGHT  10/27/2017  . IR VENOGRAM RENAL UNI RIGHT  10/27/2017    Allergies: Patient has no known allergies.  Medications: Prior to Admission medications   Medication Sig Start Date End Date Taking? Authorizing Provider  aspirin-acetaminophen-caffeine (EXCEDRIN MIGRAINE) [phone] MG tablet Take by mouth every 6 (six) hours as needed (for cramping).   Yes [provider]    HYDROcodone-acetaminophen (NORCO/VICODIN) 5-325 MG tablet Take 1 tablet by mouth every 4 (four) hours as needed for moderate pain.  10/27/17  Yes [provider]  Probiotic Product (ALIGN PO) Take 1 tablet by mouth daily.   Yes [provider]     Family History  Problem Relation Age of Onset  . Diabetes Other   . Hypertension Other   . Varicose Veins Mother     Social History   Socioeconomic History  . Marital status: Single    Spouse name: Not on file  . Number of children: Not on file  . Years of education: Not on file  . Highest education level: Not on file  Occupational History  . Not on file  Social Needs  . Financial resource strain: Not on file  . Food insecurity:    Worry: Not on file    Inability: Not on file  . Transportation needs:    Medical: Not on file    Non-medical: Not on file  Tobacco Use  . Smoking status: Never Smoker  . Smokeless tobacco: Never Used  Substance and Sexual Activity  . Alcohol use: No  . Drug use: No  . Sexual activity: Yes  Lifestyle  . Physical activity:    Days per week: Not on file    Minutes per session: Not on file  . Stress: Not on file  Relationships  . Social connections:    Talks on phone: Not on file    Gets together: Not on file    Attends religious service: Not on file    Active member  of club or organization: Not on file    Attends meetings of clubs or organizations: Not on file    Relationship status: Not on file  Other Topics Concern  . Not on file  Social History Narrative  . Not on file    Review of Systems: A 12 point ROS discussed and pertinent positives are indicated in the HPI above.  All other systems are negative.  Review of Systems  Constitutional: Negative.   Respiratory: Negative.   Cardiovascular: Negative.   Gastrointestinal: Negative.   Genitourinary: Positive for dyspareunia and pelvic pain. Negative for decreased urine volume, difficulty urinating, dysuria, flank pain,  genital sores, hematuria, urgency, vaginal bleeding and vaginal discharge.  Musculoskeletal: Negative.   Neurological: Negative.     Vital Signs: BP 100/65   Pulse 69   Temp 98 F (36.7 C) (Oral)   Resp 14   Ht 5\' 3"  (1.6 m)   Wt 75.3 kg   LMP 01/26/2018 (Approximate)   SpO2 99%   BMI 29.41 kg/m   Physical Exam  Constitutional: She is oriented to person, place, and time. No distress.  Genitourinary:  Genitourinary Comments: Palpable varicosities in the midline vulvar region immediately superior to the labia.  Visible superficial varicosities in the labia minora and majora bilaterally.  Neurological: She is alert and oriented to person, place, and time.  Skin: She is not diaphoretic.  Vitals reviewed.   Imaging: Ir Radiologist Eval & Mgmt  Result Date: 03/28/2018 Please refer to notes tab for details about interventional procedure. (Op Note)   Labs:  CBC: Recent Labs    09/28/17 1628 10/27/17 0643 10/29/17 2308  WBC 7.8 6.8 9.5  HGB 12.6 13.0 11.6*  HCT 38.4 38.9 34.1*  PLT 233 230 186    COAGS: Recent Labs    10/27/17 0643  INR 0.90  APTT 31    BMP: Recent Labs    10/29/17 2308  NA 136  K 3.7  CL 100*  CO2 26  GLUCOSE 98  BUN 13  CALCIUM 9.2  CREATININE 0.82  GFRNONAA >60  GFRAA >60    LIVER FUNCTION TESTS: Recent Labs    10/29/17 2308  BILITOT 0.6  AST 17  ALT 13*  ALKPHOS 55  PROT 6.7  ALBUMIN 3.7     Assessment and Plan:  Persistent symptomatic vulvar and labial varicosities despite prior extensive transcatheter embolization procedures 5 months ago.  A bedside ultrasound was performed today demonstrating patent and prominent vulvar varicosities in the midline extending into bilateral labial regions.  The prior embolization procedures should have been somewhat effective in treating some of the venous outflow from these varicosities but obviously there remains some inflow into varicosities causing persistent symptoms.   Ultrasound-guided sclerotherapy treatment was offered to the patient to treat symptomatic varicosities.  She is interested in pursuing treatment and treatment will be scheduled for later today.   Electronically SignedIrish Lack T 03/28/2018, 12:56 PM     I spent a total of  15 Minutes in face to face in clinical consultation, greater than 50% of which was counseling/coordinating care for symptomatic vulvar and labial varicosities.

## 2018-04-05 ENCOUNTER — Telehealth: Payer: Self-pay | Admitting: Radiology

## 2018-04-05 NOTE — Telephone Encounter (Signed)
Call for 1 wk check s/p Sclerotherapy of symptomatic vulvar and labial varicosities.  Left voice mail on patient's M#.  Amado Nash, RN 04/05/2018 3:03 PM

## 2018-04-14 ENCOUNTER — Emergency Department (HOSPITAL_COMMUNITY): Payer: Self-pay

## 2018-04-14 ENCOUNTER — Emergency Department (HOSPITAL_COMMUNITY)
Admission: EM | Admit: 2018-04-14 | Discharge: 2018-04-14 | Discharge status: Against Medical Advice | Payer: Self-pay | Attending: Emergency Medicine | Admitting: Emergency Medicine

## 2018-04-14 ENCOUNTER — Encounter (HOSPITAL_COMMUNITY): Payer: Self-pay

## 2018-04-14 DIAGNOSIS — R51 Headache: Secondary | ICD-10-CM | POA: Insufficient documentation

## 2018-04-14 DIAGNOSIS — R531 Weakness: Secondary | ICD-10-CM | POA: Insufficient documentation

## 2018-04-14 DIAGNOSIS — N9489 Other specified conditions associated with female genital organs and menstrual cycle: Secondary | ICD-10-CM | POA: Insufficient documentation

## 2018-04-14 DIAGNOSIS — R200 Anesthesia of skin: Secondary | ICD-10-CM | POA: Insufficient documentation

## 2018-04-14 DIAGNOSIS — R519 Headache, unspecified: Secondary | ICD-10-CM

## 2018-04-14 DIAGNOSIS — R079 Chest pain, unspecified: Secondary | ICD-10-CM | POA: Insufficient documentation

## 2018-04-14 LAB — DIFFERENTIAL
Abs Immature Granulocytes: 0.02 10*3/uL (ref 0.00–0.07)
Basophils Absolute: 0.1 10*3/uL (ref 0.0–0.1)
Basophils Relative: 1 %
EOS ABS: 0.3 10*3/uL (ref 0.0–0.5)
EOS PCT: 4 %
IMMATURE GRANULOCYTES: 0 %
Lymphocytes Relative: 26 %
Lymphs Abs: 1.9 10*3/uL (ref 0.7–4.0)
MONO ABS: 0.4 10*3/uL (ref 0.1–1.0)
MONOS PCT: 5 %
Neutro Abs: 4.7 10*3/uL (ref 1.7–7.7)
Neutrophils Relative %: 64 %

## 2018-04-14 LAB — CBC
HCT: 40.9 % (ref 36.0–46.0)
Hemoglobin: 13.7 g/dL (ref 12.0–15.0)
MCH: 30.9 pg (ref 26.0–34.0)
MCHC: 33.5 g/dL (ref 30.0–36.0)
MCV: 92.1 fL (ref 80.0–100.0)
NRBC: 0 % (ref 0.0–0.2)
PLATELETS: 233 10*3/uL (ref 150–400)
RBC: 4.44 MIL/uL (ref 3.87–5.11)
RDW: 11.5 % (ref 11.5–15.5)
WBC: 7.4 10*3/uL (ref 4.0–10.5)

## 2018-04-14 LAB — I-STAT BETA HCG BLOOD, ED (MC, WL, AP ONLY): I-stat hCG, quantitative: <5 m[IU]/mL (ref <5)

## 2018-04-14 LAB — COMPREHENSIVE METABOLIC PANEL
ALBUMIN: 4.4 g/dL (ref 3.5–5.0)
ALK PHOS: 63 U/L (ref 38–126)
ALT: 16 U/L (ref 0–44)
ANION GAP: 5 (ref 5–15)
AST: 19 U/L (ref 15–41)
BILIRUBIN TOTAL: 1.6 mg/dL — AB (ref 0.3–1.2)
BUN: 12 mg/dL (ref 6–20)
CALCIUM: 9.6 mg/dL (ref 8.9–10.3)
CO2: 27 mmol/L (ref 22–32)
Chloride: 106 mmol/L (ref 98–111)
Creatinine, Ser: 0.77 mg/dL (ref 0.44–1.00)
Glucose, Bld: 97 mg/dL (ref 70–99)
POTASSIUM: 3.6 mmol/L (ref 3.5–5.1)
Sodium: 138 mmol/L (ref 135–145)
TOTAL PROTEIN: 7.6 g/dL (ref 6.5–8.1)

## 2018-04-14 LAB — I-STAT TROPONIN, ED: Troponin i, poc: 0 ng/mL (ref 0.00–0.08)

## 2018-04-14 LAB — ETHANOL: Alcohol, Ethyl (B): <10 mg/dL (ref <10)

## 2018-04-14 MED ORDER — METOCLOPRAMIDE HCL 5 MG/ML IJ SOLN
10.0000 mg | Freq: Once | INTRAMUSCULAR | Status: AC
  Administered 2018-04-14: 10 mg via INTRAVENOUS
  Filled 2018-04-14: qty 2

## 2018-04-14 MED ORDER — KETOROLAC TROMETHAMINE 30 MG/ML IJ SOLN
30.0000 mg | Freq: Once | INTRAMUSCULAR | Status: AC
  Administered 2018-04-14: 30 mg via INTRAVENOUS
  Filled 2018-04-14: qty 1

## 2018-04-14 MED ORDER — DIPHENHYDRAMINE HCL 50 MG/ML IJ SOLN
25.0000 mg | Freq: Once | INTRAMUSCULAR | Status: DC
  Filled 2018-04-14: qty 1

## 2018-04-14 NOTE — ED Provider Notes (Signed)
Emergency Department Provider Note   I have reviewed the triage vital signs and the nursing notes.   HISTORY  Chief Complaint Migraine and Numbness   HPI Debra Newton is a 32 y.o. female with PMH of migraine HA presents to the emergency department for evaluation of left-sided, throbbing, severe headache with left face, arm, leg weakness/numbness.  Symptoms began this morning at 9:30 AM.  Reports gradual worsening of symptoms.  She denies any fevers or chills.  Denies any sudden onset, maximal intensity headache symptoms.  She is seeing "spots" in both eyes.  Denies any photophobia.  She describes especially severe tingling pain in the left hand and left foot.  No prior history of similar symptoms with migraine headaches although she has had migraines before.  No changes in medications.  Patient has not taken any over-the-counter medications.  No other modifying factors.  Patient also endorses some hear palpitations with nausea. Denies any specific CP or SOB.   Recent surgery history includes bilateral inguinal venography with embolization on 10/27/2017.   Past Medical History:  Diagnosis Date  . Preeclampsia   . Varicose veins     Patient Active Problem List   Diagnosis Date Noted  . Female pelvic congestion syndrome 09/28/2017    Past Surgical History:  Procedure Laterality Date  . IR ANGIOGRAM SELECTIVE EACH ADDITIONAL VESSEL  10/27/2017  . IR ANGIOGRAM SELECTIVE EACH ADDITIONAL VESSEL  10/27/2017  . IR ANGIOGRAM SELECTIVE EACH ADDITIONAL VESSEL  10/27/2017  . IR ANGIOGRAM SELECTIVE EACH ADDITIONAL VESSEL  10/27/2017  . IR EMBO VENOUS NOT HEMORR HEMANG  INC GUIDE ROADMAPPING  10/27/2017  . IR RADIOLOGIST EVAL & MGMT  09/21/2017  . IR RADIOLOGIST EVAL & MGMT  03/28/2018  . IR US GUIDE VASC ACCESS RIGHT  10/27/2017  . IR US GUIDE VASC ACCESS RIGHT  10/27/2017  . IR VENOGRAM RENAL UNI RIGHT  10/27/2017   Allergies Patient has no known allergies.  Family History    Problem Relation Age of Onset  . Diabetes Other   . Hypertension Other   . Varicose Veins Mother     Social History Social History   Tobacco Use  . Smoking status: Never Smoker  . Smokeless tobacco: Never Used  Substance Use Topics  . Alcohol use: No  . Drug use: No    Review of Systems  Constitutional: No fever/chills Eyes: Positive spots in vision.  ENT: No sore throat. Cardiovascular: Denies chest pain. Respiratory: Denies shortness of breath. Gastrointestinal: No abdominal pain.  No nausea, no vomiting.  No diarrhea.  No constipation. Genitourinary: Negative for dysuria. Musculoskeletal: Negative for back pain. Skin: Negative for rash. Neurological: Negative for focal weakness or numbness. Positive HA with left arm, face, legs weakness/nubness.   10-point ROS otherwise negative.  ____________________________________________   PHYSICAL EXAM:  VITAL SIGNS: ED Triage Vitals  Enc Vitals Group     BP 04/14/18 1500 115/73     Pulse Rate 04/14/18 1500 83     Resp 04/14/18 1504 (!) 21     Temp 04/14/18 1504 98.3 F (36.8 C)     Temp Source 04/14/18 1504 Oral     SpO2 04/14/18 1500 100 %     Weight 04/14/18 1501 160 lb (72.6 kg)     Height 04/14/18 1501 5\' 3"  (1.6 m)     Pain Score 04/14/18 1501 10   Constitutional: Alert and oriented. Patient appears uncomfortable and shielding eyes from lights in the exam room.  Eyes: Conjunctivae are  normal. PERRL.  Head: Atraumatic. Nose: No congestion/rhinnorhea. Mouth/Throat: Mucous membranes are moist.  Neck: No stridor.  No meningeal signs.   Cardiovascular: Normal rate, regular rhythm. Good peripheral circulation. Grossly normal heart sounds.   Respiratory: Normal respiratory effort.  No retractions. Lungs CTAB. Gastrointestinal: No distention.  Musculoskeletal: No lower extremity tenderness nor edema. No gross deformities of extremities. Neurologic:  Normal speech and language. Subjective numbness to light touch  over the left face, arm, and leg. Mild weakness on the left arm/leg 4+/5. Decreased sensation of the left face.  Skin:  Skin is warm, dry and intact. No rash noted.  ____________________________________________   LABS (all labs ordered are listed, but only abnormal results are displayed)  Labs Reviewed  COMPREHENSIVE METABOLIC PANEL - Abnormal; Notable for the following components:      Result Value   Total Bilirubin 1.6 (*)    All other components within normal limits  CBC  DIFFERENTIAL  ETHANOL  RAPID URINE DRUG SCREEN, HOSP PERFORMED  URINALYSIS, ROUTINE W REFLEX MICROSCOPIC  I-STAT TROPONIN, ED  I-STAT BETA HCG BLOOD, ED ([facility], [facility], [facility] ONLY)   ____________________________________________  EKG   EKG Interpretation  Date/Time:  Saturday April 14 2018 15:03:26 EST Ventricular Rate:  88 PR Interval:    QRS Duration: 81 QT Interval:  317 QTC Calculation: 384 R Axis:   72 Text Interpretation:  Sinus rhythm No STEMI.  Confirmed by Alona Bene [phone]) on 04/14/2018 3:05:49 PM       ____________________________________________  RADIOLOGY  Dg Chest 2 View  Result Date: 04/14/2018 CLINICAL DATA:  Chest pain for several hours EXAM: CHEST - 2 VIEW COMPARISON:  01/30/2018 FINDINGS: The heart size and mediastinal contours are within normal limits. Both lungs are clear. The visualized skeletal structures are unremarkable. IMPRESSION: No active cardiopulmonary disease. Electronically Signed   By: Alcide Clever M.D.   On: 04/14/2018 16:02   Ct Head Wo Contrast  Result Date: 04/14/2018 CLINICAL DATA:  Headache, left-sided weakness EXAM: CT HEAD WITHOUT CONTRAST TECHNIQUE: Contiguous axial images were obtained from the base of the skull through the vertex without intravenous contrast. COMPARISON:  10/30/2017 FINDINGS: Brain: No acute intracranial abnormality. Specifically, no hemorrhage, hydrocephalus, mass lesion, acute infarction, or significant intracranial injury. Vascular: No  hyperdense vessel or unexpected calcification. Skull: No acute calvarial abnormality. Sinuses/Orbits: Visualized paranasal sinuses and mastoids clear. Orbital soft tissues unremarkable. Other: None IMPRESSION: Normal study. Electronically Signed   By: Charlett Nose M.D.   On: 04/14/2018 16:09    ____________________________________________   PROCEDURES  Procedure(s) performed:   Procedures  None  ____________________________________________   INITIAL IMPRESSION / ASSESSMENT AND PLAN / ED COURSE  Pertinent labs & imaging results that were available during my care of the patient were reviewed by me and considered in my medical decision making (see chart for details).   Patient presents to the emergency department for evaluation of headache symptoms with left arm/leg/face numbness and weakness.  Patient does have some subjective numbness symptoms on exam with mild left arm and leg weakness.  No facial droop.  No meningismus or concern for infectious etiology.  Extremely low suspicion for subarachnoid hemorrhage given the gradual/worsening presentation of symptoms.  My suspicion for complex migraine is elevated.  Given her neurological deficits I do plan on obtaining a CT scan of the head along with lab work.  Patient describing some heart palpitation symptoms with largely unremarkable EKG which was reviewed.  Plan for chest x-ray and single troponin with symptoms going on  for the past 6 hours. Doubt ACS or PE.   4:35 PM Patient has eloped from the emergency department.  CT scan reviewed along with chest x-ray which are unremarkable.  Patient's lab work is coming back and normal.  She would not return to discuss her results or plan for further testing with me.  Was able to stop her and remove her IV but could not convince her to return.  Patient was ambulating with a steady gait.  Discussed my concern for possible stroke or other acute neurologic process during my initial evaluation with the  patient and indicated that if initial testing was normal we might consider more advanced testing such as MRI. No clear indication that the patient was upset with care or planned to leave prior to walking out. Nursing noted gait to be swift and normal.   ____________________________________________  FINAL CLINICAL IMPRESSION(S) / ED DIAGNOSES  Final diagnoses:  Acute nonintractable headache, unspecified headache type  Left-sided weakness  Left sided numbness     MEDICATIONS GIVEN DURING THIS VISIT:  Medications  diphenhydrAMINE (BENADRYL) injection 25 mg (has no administration in time range)  ketorolac (TORADOL) 30 MG/ML injection 30 mg (30 mg Intravenous Given 04/14/18 1557)  metoCLOPramide (REGLAN) injection 10 mg (10 mg Intravenous Given 04/14/18 1556)    Note:  This document was prepared using Dragon voice recognition software and may include unintentional dictation errors.  Alona Bene, MD Emergency Medicine    , Arlyss Repress, MD 04/14/18 [phone]

## 2018-04-14 NOTE — ED Notes (Signed)
Pt back from CT

## 2018-04-14 NOTE — ED Notes (Signed)
Pt refused to stay, pt walking down the hall with IV access. IV removed in hall.

## 2018-04-14 NOTE — ED Triage Notes (Signed)
Pt from home; c/o migraine, numbness and tingling in L hand, face and, leg, weakness; started around 0930 this am; hx migraines "but never this bad"; pt also c/o palpitations; pt endorses uterine surgery (May 31st); pt endorses seeing spots in both eyes

## 2018-04-18 ENCOUNTER — Ambulatory Visit
Admission: RE | Admit: 2018-04-18 | Discharge: 2018-04-18 | Disposition: A | Discharge status: Home / Self Care | Payer: Self-pay | Source: Ambulatory Visit | Attending: Interventional Radiology | Admitting: Interventional Radiology

## 2018-04-18 DIAGNOSIS — I863 Vulval varices: Secondary | ICD-10-CM

## 2018-04-18 NOTE — Progress Notes (Signed)
Chief Complaint: Status post ultrasound-guided sclerotherapy of vulvar and labial varicosities on 03/28/2018.  History of Present Illness: Debra Newton is a 32 y.o. 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  She is status post prior transcatheter coil embolization of right internal iliac vein outflow, balloon occlusion sclerotherapy of the right gonadal vein and vascular plug occlusion of bilateral gonadal vein trunks on 10/27/2017.  Persistent symptomatic labial and vulvar varicosities were additionally treated with ultrasound-guided foam sclerotherapy on 03/28/2018.  The patient states that she initially had some pain related to inflammation after sclerotherapy.  She can still palpate some hardened varicosities after sclerotherapy.  She does not feel that her chronic symptoms of pelvic and genital throbbing and pain have improved significantly after sclerotherapy.  She denies fever, skin ulceration or discharge.  Past Medical History:  Diagnosis Date  . Preeclampsia   . Varicose veins     Past Surgical History:  Procedure Laterality Date  . IR ANGIOGRAM SELECTIVE EACH ADDITIONAL VESSEL  10/27/2017  . IR ANGIOGRAM SELECTIVE EACH ADDITIONAL VESSEL  10/27/2017  . IR ANGIOGRAM SELECTIVE EACH ADDITIONAL VESSEL  10/27/2017  . IR ANGIOGRAM SELECTIVE EACH ADDITIONAL VESSEL  10/27/2017  . IR EMBO VENOUS NOT HEMORR HEMANG  INC GUIDE ROADMAPPING  10/27/2017  . IR RADIOLOGIST EVAL & MGMT  09/21/2017  . IR RADIOLOGIST EVAL & MGMT  03/28/2018  . IR US GUIDE VASC ACCESS RIGHT  10/27/2017  . IR US GUIDE VASC ACCESS RIGHT  10/27/2017  . IR VENOGRAM RENAL UNI RIGHT  10/27/2017    Allergies: Patient has no known allergies.  Medications: Prior to Admission medications   Medication Sig Start Date End Date Taking? Authorizing Provider  aspirin-acetaminophen-caffeine (EXCEDRIN MIGRAINE) [phone] MG  tablet Take by mouth every 6 (six) hours as needed (for cramping).    [provider]  HYDROcodone-acetaminophen (NORCO/VICODIN) 5-325 MG tablet Take 1 tablet by mouth every 4 (four) hours as needed for moderate pain.  10/27/17   [provider]  Probiotic Product (ALIGN PO) Take 1 tablet by mouth daily.    [provider]     Family History  Problem Relation Age of Onset  . Diabetes Other   . Hypertension Other   . Varicose Veins Mother     Social History   Socioeconomic History  . Marital status: Single    Spouse name: Not on file  . Number of children: Not on file  . Years of education: Not on file  . Highest education level: Not on file  Occupational History  . Not on file  Social Needs  . Financial resource strain: Not on file  . Food insecurity:    Worry: Not on file    Inability: Not on file  . Transportation needs:    Medical: Not on file    Non-medical: Not on file  Tobacco Use  . Smoking status: Never Smoker  . Smokeless tobacco: Never Used  Substance and Sexual Activity  . Alcohol use: No  . Drug use: No  . Sexual activity: Yes  Lifestyle  . Physical activity:    Days per week: Not on file    Minutes per session: Not on file  . Stress: Not on file  Relationships  . Social connections:    Talks on phone: Not on file    Gets together: Not on file    Attends religious service: Not on file  Active member of club or organization: Not on file    Attends meetings of clubs or organizations: Not on file    Relationship status: Not on file  Other Topics Concern  . Not on file  Social History Narrative  . Not on file    Review of Systems: A 12 point ROS discussed and pertinent positives are indicated in the HPI above.  All other systems are negative.  Review of Systems  Constitutional: Negative.   Respiratory: Negative.   Cardiovascular: Negative.   Gastrointestinal: Negative.   Endocrine: Negative.   Genitourinary: Positive  for dyspareunia and pelvic pain.  Musculoskeletal: Negative.   Skin: Negative.   Neurological: Negative.     Vital Signs: There were no vitals taken for this visit.  Physical Exam  Constitutional: She is oriented to person, place, and time.  Genitourinary:  Genitourinary Comments: No skin breakdown or ulcerations after sclerotherapy.  Musculoskeletal: She exhibits no edema.  Neurological: She is alert and oriented to person, place, and time.  Skin: Skin is warm and dry.  Vitals reviewed.   Imaging: Dg Chest 2 View  Result Date: 04/14/2018 CLINICAL DATA:  Chest pain for several hours EXAM: CHEST - 2 VIEW COMPARISON:  01/30/2018 FINDINGS: The heart size and mediastinal contours are within normal limits. Both lungs are clear. The visualized skeletal structures are unremarkable. IMPRESSION: No active cardiopulmonary disease. Electronically Signed   By: Alcide Clever M.D.   On: 04/14/2018 16:02   Ct Head Wo Contrast  Result Date: 04/14/2018 CLINICAL DATA:  Headache, left-sided weakness EXAM: CT HEAD WITHOUT CONTRAST TECHNIQUE: Contiguous axial images were obtained from the base of the skull through the vertex without intravenous contrast. COMPARISON:  10/30/2017 FINDINGS: Brain: No acute intracranial abnormality. Specifically, no hemorrhage, hydrocephalus, mass lesion, acute infarction, or significant intracranial injury. Vascular: No hyperdense vessel or unexpected calcification. Skull: No acute calvarial abnormality. Sinuses/Orbits: Visualized paranasal sinuses and mastoids clear. Orbital soft tissues unremarkable. Other: None IMPRESSION: Normal study. Electronically Signed   By: Charlett Nose M.D.   On: 04/14/2018 16:09   [place] Injec Sclerotherapy Mult  Result Date: 03/28/2018 INDICATION: Pelvic venous insufficiency and symptomatic vulvar and labial varicosities. EXAM: ULTRASOUND INJECTION OF SCLEROSANT; MULTIPLE INCOMPETENT VEINS MEDICATIONS: None. ANESTHESIA/SEDATION: None. COMPLICATIONS:  None immediate. PROCEDURE: Informed written consent was obtained from the patient after a thorough discussion of the procedural risks, benefits and alternatives. All questions were addressed. Maximal Sterile Barrier Technique was utilized including caps, mask, sterile gowns, sterile gloves, sterile drape, hand hygiene and skin antiseptic. A timeout was performed prior to the initiation of the procedure. Survey ultrasound of the vulva and labial region was performed and appropriate skin entry sites were marked. The sites were then prepped with chlorhexidine and draped in standard fashion. Under direct ultrasound guidance a foam of air and 2 mL of 1% polidocanol was injected in small aliquots into the residual patent symptomatic varicose veins until there was good filling of the affected regions. Dispersal of sclerosant was confirmed by real-time ultrasound imaging. FINDINGS: Ultrasound demonstrates multiple enlarged superficial varicosities predominantly in the midline overlying the vulva and labial region. Vulvar varicosities communicate with a tortuous network of multiple varicosities traveling into both labial regions bilaterally. Separate right and left-sided injections were performed with use of a full 2 mL vial of polidocanol. Ultrasound demonstrates excellent dose bursal of sclerosant throughout the network varicosities. IMPRESSION: Ultrasound-guided foam sclerotherapy performed of symptomatic vulvar and labial varicosities. Electronically Signed   By: Rudene Anda.D.  On: 03/28/2018 17:30   [place] Rad Eval And Mgmt  Result Date: 04/18/2018 Please refer to "Notes" to see consult details.  [place] Abdominal Pelvic Art/vent Flow Doppler  Result Date: 04/18/2018 CLINICAL DATA:  Pelvic venous insufficiency and status post ultrasound-guided sclerotherapy symptomatic vulvar and labial varicosities on 03/28/2018. EXAM: LIMITED ULTRASOUND OF PELVIS TECHNIQUE: Limited transabdominal ultrasound examination of the  pelvis was performed. COMPARISON:  03/28/2018 FINDINGS: Ultrasound demonstrates thrombosis and partial thrombosis of multiple vulvar and bilateral labial superficial varicosities. There are some remaining open varicosities. Overall appearance has improved with less numerous patent varicosities identified by ultrasound. IMPRESSION: Thrombosis and partial thrombosis of multiple vulvar and labial varicosities after sclerotherapy. There are some remaining open superficial varicosities. Overall appearance is improved after sclerotherapy. Electronically Signed   By: Irish LackGlenn  China Deitrick M.D.   On: 04/18/2018 11:36   Ir Radiologist Eval & Mgmt  Result Date: 03/28/2018 Please refer to notes tab for details about interventional procedure. (Op Note)   Labs:  CBC: Recent Labs    09/28/17 1628 10/27/17 0643 10/29/17 2308 04/14/18 1500  WBC 7.8 6.8 9.5 7.4  HGB 12.6 13.0 11.6* 13.7  HCT 38.4 38.9 34.1* 40.9  PLT 233 230 186 233    COAGS: Recent Labs    10/27/17 0643  INR 0.90  APTT 31    BMP: Recent Labs    10/29/17 2308 04/14/18 1500  NA 136 138  K 3.7 3.6  CL 100* 106  CO2 26 27  GLUCOSE 98 97  BUN 13 12  CALCIUM 9.2 9.6  CREATININE 0.82 0.77  GFRNONAA >60 >60  GFRAA >60 >60    LIVER FUNCTION TESTS: Recent Labs    10/29/17 2308 04/14/18 1500  BILITOT 0.6 1.6*  AST 17 19  ALT 13* 16  ALKPHOS 55 63  PROT 6.7 7.6  ALBUMIN 3.7 4.4     Assessment and Plan:  Ultrasound today demonstrates thrombosis and partial thrombosis of multiple injected labial and vulvar varicosities after ultrasound-guided foam sclerotherapy 4 weeks ago.  Mrs. Jeannie DoneMiranda Serrano has not had significant symptomatic relief yet after the most recent sclerotherapy.  I did recommend that we give the most recent treatment some further time to determine efficacy given that the ultrasound shows thrombosis of multiple varicosities and overall less prominent varicosities compared to prior to sclerotherapy.  I  will follow-up with her in 1 month.  If symptoms do not improve or varicosities worsen clinically, we could consider further sclerotherapy or additional CT to determine if other venous pathways have developed to supply varicosities that might be able to be treated by transcatheter means.   Electronically SignedIrish Lack: Caley Ciaramitaro T 04/18/2018, 11:40 AM     I spent a total of  15 Minutes in face to face in clinical consultation, greater than 50% of which was counseling/coordinating care for pelvic venous insufficiency and symptomatic vulvar and labial varicosities.

## 2018-07-16 ENCOUNTER — Other Ambulatory Visit: Payer: Self-pay | Admitting: Interventional Radiology

## 2018-07-16 DIAGNOSIS — I863 Vulval varices: Secondary | ICD-10-CM

## 2018-07-19 ENCOUNTER — Ambulatory Visit
Admission: RE | Admit: 2018-07-19 | Discharge: 2018-07-19 | Disposition: A | Discharge status: Home / Self Care | Payer: Self-pay | Source: Ambulatory Visit | Attending: Interventional Radiology | Admitting: Interventional Radiology

## 2018-07-19 ENCOUNTER — Other Ambulatory Visit: Payer: Self-pay | Admitting: Interventional Radiology

## 2018-07-19 DIAGNOSIS — I863 Vulval varices: Secondary | ICD-10-CM

## 2018-07-19 HISTORY — PX: IR RADIOLOGIST EVAL & MGMT: IMG5224

## 2018-07-19 NOTE — Progress Notes (Signed)
Chief Complaint: Patient was seen in consultation today for  Chief Complaint  Patient presents with  . Follow-up    4 mo S/P US guided Sclero of Vulvar & Labial Varicosities      Supervising Physician: Irish LackYamagata, Glenn  History of Present Illness: Debra Newton is a 33 y.o. 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 Fredia SorrowYamagata who presents today for follow up. Debra Newton reports that after her most recent injections she initially felt some symptomatic relief and felt that the veins on her labia appeared smaller for awhile, however she now feels that her symptoms and the appearance of the veins on her labia are back to the way they were before she had the injections. She also reports that she feels like the varicose veins on her lower extremities are worsening - they are very painful and sometimes cause her to have difficulty walking.   She expresses multiple concerns to me today, most notably - pain and pressure in both of her labia, pain during and after intercourse, swelling of both of her labia (R>L), labial and vaginal itching, watery malodorous discharge which she can only describe as "nasty", lower back pain, dysuria, urgency, feeling of incomplete bladder emptying, intermittent fever and chills, nausea without vomiting, abdominal distention, intermittent diarrhea, intermittent constipation, abdominal pain that she describes as "10/10 like a contraction) that intermittently occurs when she walks and migraines. She denies cough, dyspnea, vomiting, poor appetite, melena, hematochezia or abnormal  vaginal bleeding. She does not have a PCP and reports last OB/GYN visit was >1 year ago. She does not take any prescribed medications, only OTC headache pain relievers when she has a migraine. She is requesting to have another round of injections today as she felt the last time these were performed she had some relief for awhile.   Past Medical History:  Diagnosis Date  . Preeclampsia   . Varicose veins     Past Surgical History:  Procedure Laterality Date  . IR ANGIOGRAM SELECTIVE EACH ADDITIONAL VESSEL  10/27/2017  . IR ANGIOGRAM SELECTIVE EACH ADDITIONAL VESSEL  10/27/2017  . IR ANGIOGRAM SELECTIVE EACH ADDITIONAL VESSEL  10/27/2017  . IR ANGIOGRAM SELECTIVE EACH ADDITIONAL VESSEL  10/27/2017  . IR EMBO VENOUS NOT HEMORR HEMANG  INC GUIDE ROADMAPPING  10/27/2017  . IR RADIOLOGIST EVAL & MGMT  09/21/2017  . IR RADIOLOGIST EVAL & MGMT  03/28/2018  . IR US GUIDE VASC ACCESS RIGHT  10/27/2017  . IR US GUIDE VASC ACCESS RIGHT  10/27/2017  . IR VENOGRAM RENAL UNI RIGHT  10/27/2017    Allergies: Patient has no known allergies.  Medications: Prior to Admission medications   Medication Sig Start Date End Date Taking? Authorizing Provider  aspirin-acetaminophen-caffeine (EXCEDRIN MIGRAINE) [phone] MG tablet Take by mouth every 6 (six) hours as needed (for cramping).   Yes [provider]  Probiotic Product (ALIGN PO) Take 1 tablet by mouth daily.   Yes [provider]  HYDROcodone-acetaminophen (NORCO/VICODIN) 5-325 MG tablet Take 1 tablet by mouth every 4 (four) hours as needed for moderate pain.  10/27/17   [provider]     Family History  Problem Relation Age  of Onset  . Diabetes Other   . Hypertension Other   . Varicose Veins Mother     Social History   Socioeconomic History  . Marital status: Single    Spouse name: Not on file  . Number of children: Not on file  . Years of education: Not on file  . Highest education level: Not on file  Occupational  History  . Not on file  Social Needs  . Financial resource strain: Not on file  . Food insecurity:    Worry: Not on file    Inability: Not on file  . Transportation needs:    Medical: Not on file    Non-medical: Not on file  Tobacco Use  . Smoking status: Never Smoker  . Smokeless tobacco: Never Used  Substance and Sexual Activity  . Alcohol use: No  . Drug use: No  . Sexual activity: Yes  Lifestyle  . Physical activity:    Days per week: Not on file    Minutes per session: Not on file  . Stress: Not on file  Relationships  . Social connections:    Talks on phone: Not on file    Gets together: Not on file    Attends religious service: Not on file    Active member of club or organization: Not on file    Attends meetings of clubs or organizations: Not on file    Relationship status: Not on file  Other Topics Concern  . Not on file  Social History Narrative  . Not on file    Review of Systems: A 12 point ROS discussed and pertinent positives are indicated in the HPI above.  All other systems are negative.  Review of Systems  Constitutional: Positive for activity change (less d/t leg and groin pain), chills (occasionally), fatigue and fever (occasionally). Negative for diaphoresis and unexpected weight change.  Respiratory: Negative for cough and shortness of breath.   Cardiovascular: Negative for chest pain and leg swelling.  Gastrointestinal: Positive for abdominal distention, abdominal pain, constipation, diarrhea and nausea. Negative for anal bleeding, blood in stool and vomiting.  Genitourinary: Positive for dyspareunia, dysuria, flank pain, frequency, urgency, vaginal discharge and vaginal pain. Negative for hematuria and vaginal bleeding.       (+) feeling of incomplete bladder emptying  Musculoskeletal: Positive for back pain (lower).  Skin: Negative for color change.       (+) feels like varicose veins are getting bigger and multiplying  Neurological: Positive for  headaches. Negative for dizziness and syncope.    Vital Signs: BP 104/69   Pulse 74   Temp 98.8 F (37.1 C) (Oral)   Resp 14   Ht 5\' 3"  (1.6 m)   LMP 07/05/2018 (Approximate)   SpO2 96%   BMI 28.34 kg/m   Physical Exam Vitals signs reviewed.  Constitutional:      General: She is not in acute distress.    Appearance: Normal appearance. She is not ill-appearing.  HENT:     Head: Normocephalic.  Cardiovascular:     Rate and Rhythm: Normal rate.  Pulmonary:     Effort: Pulmonary effort is normal.  Abdominal:     Palpations: Abdomen is soft.     Tenderness: There is no abdominal tenderness.  Skin:    General: Skin is warm and dry.     Comments: (+) diffuse varicosities BLE  Neurological:     Mental Status: She is alert and oriented to person, place, and time.  Imaging: No results found.  Labs:  CBC: Recent Labs    09/28/17 1628 10/27/17 0643 10/29/17 2308 04/14/18 1500  WBC 7.8 6.8 9.5 7.4  HGB 12.6 13.0 11.6* 13.7  HCT 38.4 38.9 34.1* 40.9  PLT 233 230 186 233    COAGS: Recent Labs    10/27/17 0643  INR 0.90  APTT 31    BMP: Recent Labs    10/29/17 2308 04/14/18 1500  NA 136 138  K 3.7 3.6  CL 100* 106  CO2 26 27  GLUCOSE 98 97  BUN 13 12  CALCIUM 9.2 9.6  CREATININE 0.82 0.77  GFRNONAA >60 >60  GFRAA >60 >60    LIVER FUNCTION TESTS: Recent Labs    10/29/17 2308 04/14/18 1500  BILITOT 0.6 1.6*  AST 17 19  ALT 13* 16  ALKPHOS 55 63  PROT 6.7 7.6  ALBUMIN 3.7 4.4    TUMOR MARKERS: No results for input(s): AFPTM, CEA, CA199, CHROMGRNA in the last 8760 hours.  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 Fredia Sorrow who presents today for follow up.  Patient expresses many concerns today - most notable for increased pain, pressure, itching and swelling of her labia since her last sclerotherapy treatment. She reports some relief after the last sclerotherapy procedure and is requesting that the procedure be repeated today if possible so that she may have some symptomatic relief.  Patient was seen by myself and Dr. Fredia Sorrow - discussed the difficult and unique nature of her varicosities as well as possible interventions moving forward, however the best plan currently would be to obtain repeat imaging to further assess her prior to proceeding with any further large intervention such as repeat venography. Patient states understanding and agrees to this - she only requests that the imaging be performed and [facility] or [facility]. Additionally, we will examine her labial varicosities and likely repeat sclerotherapy today to provide some symptomatic relief before we can see her again.  Encouraged patient to obtain a PCP/present to urgent care if possible to discuss some of her other symptoms she has described today as well as possibly following up with her OB/GYN. She reports a difficult insurance/financial situation but states that she will try.    Electronically Signed: Villa Herb PA-C 07/19/2018, 2:42 PM   Please refer to Dr. Antonietta Jewel attestation of this note for management and plan.

## 2018-07-20 ENCOUNTER — Encounter: Payer: Self-pay | Admitting: *Deleted

## 2018-08-13 ENCOUNTER — Ambulatory Visit (INDEPENDENT_AMBULATORY_CARE_PROVIDER_SITE_OTHER): Payer: Self-pay | Admitting: Otolaryngology

## 2018-08-13 DIAGNOSIS — J310 Chronic rhinitis: Secondary | ICD-10-CM

## 2018-08-13 DIAGNOSIS — J3503 Chronic tonsillitis and adenoiditis: Secondary | ICD-10-CM

## 2018-08-13 DIAGNOSIS — J343 Hypertrophy of nasal turbinates: Secondary | ICD-10-CM

## 2018-08-13 DIAGNOSIS — H6983 Other specified disorders of Eustachian tube, bilateral: Secondary | ICD-10-CM

## 2018-08-13 DIAGNOSIS — J353 Hypertrophy of tonsils with hypertrophy of adenoids: Secondary | ICD-10-CM

## 2018-09-06 ENCOUNTER — Encounter (HOSPITAL_COMMUNITY): Payer: Self-pay

## 2018-09-06 ENCOUNTER — Other Ambulatory Visit: Payer: Self-pay

## 2018-09-06 ENCOUNTER — Emergency Department (HOSPITAL_COMMUNITY)
Admission: EM | Admit: 2018-09-06 | Discharge: 2018-09-06 | Disposition: A | Discharge status: Home / Self Care | Payer: Self-pay | Attending: Emergency Medicine | Admitting: Emergency Medicine

## 2018-09-06 DIAGNOSIS — Z79899 Other long term (current) drug therapy: Secondary | ICD-10-CM | POA: Insufficient documentation

## 2018-09-06 DIAGNOSIS — N9489 Other specified conditions associated with female genital organs and menstrual cycle: Secondary | ICD-10-CM | POA: Insufficient documentation

## 2018-09-06 DIAGNOSIS — R110 Nausea: Secondary | ICD-10-CM | POA: Insufficient documentation

## 2018-09-06 DIAGNOSIS — R102 Pelvic and perineal pain: Secondary | ICD-10-CM

## 2018-09-06 LAB — COMPREHENSIVE METABOLIC PANEL
ALT: 27 U/L (ref 0–44)
AST: 27 U/L (ref 15–41)
Albumin: 3.8 g/dL (ref 3.5–5.0)
Alkaline Phosphatase: 63 U/L (ref 38–126)
Anion gap: 10 (ref 5–15)
BUN: 14 mg/dL (ref 6–20)
CO2: 24 mmol/L (ref 22–32)
Calcium: 9.4 mg/dL (ref 8.9–10.3)
Chloride: 103 mmol/L (ref 98–111)
Creatinine, Ser: 0.76 mg/dL (ref 0.44–1.00)
GFR calc Af Amer: >60 mL/min (ref >60)
GFR calc non Af Amer: >60 mL/min (ref >60)
Glucose, Bld: 113 mg/dL — ABNORMAL HIGH (ref 70–99)
Potassium: 3.6 mmol/L (ref 3.5–5.1)
Sodium: 137 mmol/L (ref 135–145)
Total Bilirubin: 0.7 mg/dL (ref 0.3–1.2)
Total Protein: 6.8 g/dL (ref 6.5–8.1)

## 2018-09-06 LAB — CBC
HCT: 37 % (ref 36.0–46.0)
Hemoglobin: 12.9 g/dL (ref 12.0–15.0)
MCH: 32.6 pg (ref 26.0–34.0)
MCHC: 34.9 g/dL (ref 30.0–36.0)
MCV: 93.4 fL (ref 80.0–100.0)
Platelets: 237 10*3/uL (ref 150–400)
RBC: 3.96 MIL/uL (ref 3.87–5.11)
RDW: 11.3 % — ABNORMAL LOW (ref 11.5–15.5)
WBC: 8.7 10*3/uL (ref 4.0–10.5)
nRBC: 0 % (ref 0.0–0.2)

## 2018-09-06 LAB — WET PREP, GENITAL
Clue Cells Wet Prep HPF POC: NONE SEEN
Sperm: NONE SEEN
Trich, Wet Prep: NONE SEEN
Yeast Wet Prep HPF POC: NONE SEEN

## 2018-09-06 LAB — URINALYSIS, ROUTINE W REFLEX MICROSCOPIC
Bilirubin Urine: NEGATIVE
Glucose, UA: NEGATIVE mg/dL
Ketones, ur: NEGATIVE mg/dL
Leukocytes,Ua: NEGATIVE
Nitrite: NEGATIVE
Protein, ur: NEGATIVE mg/dL
Specific Gravity, Urine: 1.029 (ref 1.005–1.030)
pH: 5 (ref 5.0–8.0)

## 2018-09-06 LAB — I-STAT BETA HCG BLOOD, ED (MC, WL, AP ONLY): I-stat hCG, quantitative: <5 m[IU]/mL (ref <5)

## 2018-09-06 LAB — LIPASE, BLOOD: Lipase: 27 U/L (ref 11–51)

## 2018-09-06 MED ORDER — SODIUM CHLORIDE 0.9% FLUSH: 3.0000 mL | Freq: Once | INTRAVENOUS | Status: DC

## 2018-09-06 NOTE — ED Provider Notes (Signed)
[facility] Provider Note   CSN: [id number] Arrival date & time: 09/06/18  2055    History   Chief Complaint Chief Complaint  Patient presents with  . Abdominal Pain    HPI De[facility] is a 33 y.o. female who presents to the ED complaining of constant, gradually worsening, pelvic pain x 1 year. Pt reports she has pelvic congestion syndrome and had a bilateral gonadal venography with embolization done about 1 year ago; afterwards she began having a stabbing sensation in her pelvic area which was constant but became much more severe today, prompting her to come to the ED. Pt also endorses intermittent nausea for the last year but no vomiting. She states she has not taken anything for the pain. Denies fever, chills, chest pain, urinary symptoms, diarrhea, constipation, vaginal bleeding, or any other associated symptoms. LNMP 2 months ago; pt reports her periods are irregular; she denies change of pregnancy although she has been sexually active with husband.        Past Medical History:  Diagnosis Date  . Preeclampsia   . Varicose veins     Patient Active Problem List   Diagnosis Date Noted  . Female pelvic congestion syndrome 09/28/2017    Past Surgical History:  Procedure Laterality Date  . IR ANGIOGRAM SELECTIVE EACH ADDITIONAL VESSEL  10/27/2017  . IR ANGIOGRAM SELECTIVE EACH ADDITIONAL VESSEL  10/27/2017  . IR ANGIOGRAM SELECTIVE EACH ADDITIONAL VESSEL  10/27/2017  . IR ANGIOGRAM SELECTIVE EACH ADDITIONAL VESSEL  10/27/2017  . IR EMBO VENOUS NOT HEMORR HEMANG  INC GUIDE ROADMAPPING  10/27/2017  . IR RADIOLOGIST EVAL & MGMT  09/21/2017  . IR RADIOLOGIST EVAL & MGMT  03/28/2018  . IR RADIOLOGIST EVAL & MGMT  07/19/2018  . IR US GUIDE VASC ACCESS RIGHT  10/27/2017  . IR US GUIDE VASC ACCESS RIGHT  10/27/2017  . IR VENOGRAM RENAL UNI RIGHT  10/27/2017     OB History    Gravida  4   Para  4   Term  4   Preterm  0   AB  0    Living  4     SAB  0   TAB  0   Ectopic  0   Multiple  0   Live Births  1            Home Medications    Prior to Admission medications   Medication Sig Start Date End Date Taking? Authorizing Provider  aspirin-acetaminophen-caffeine (EXCEDRIN MIGRAINE) [phone] MG tablet Take by mouth every 6 (six) hours as needed (for cramping).    [provider]  HYDROcodone-acetaminophen (NORCO/VICODIN) 5-325 MG tablet Take 1 tablet by mouth every 4 (four) hours as needed for moderate pain.  10/27/17   [provider]  Probiotic Product (ALIGN PO) Take 1 tablet by mouth daily.    [provider]    Family History Family History  Problem Relation Age of Onset  . Diabetes Other   . Hypertension Other   . Varicose Veins Mother     Social History Social History   Tobacco Use  . Smoking status: Never Smoker  . Smokeless tobacco: Never Used  Substance Use Topics  . Alcohol use: No  . Drug use: No     Allergies   Patient has no known allergies.   Review of Systems Review of Systems  Constitutional: Negative for chills and fever.  HENT: Negative for congestion.   Eyes: Negative  for redness.  Respiratory: Negative for shortness of breath.   Cardiovascular: Negative for chest pain.  Gastrointestinal: Positive for nausea. Negative for abdominal pain, constipation, diarrhea and vomiting.  Genitourinary: Positive for pelvic pain. Negative for vaginal bleeding.  Musculoskeletal: Negative for back pain.  Skin: Negative for rash.  Neurological: Negative for headaches.     Physical Exam Updated Vital Signs BP 111/74 (BP Location: Right Arm)   Pulse 82   Temp 97.7 F (36.5 C) (Oral)   Resp 16   Ht 5\' 4"  (1.626 m)   Wt 74.8 kg   SpO2 97%   BMI 28.32 kg/m   Physical Exam Vitals signs and nursing note reviewed. Exam conducted with a chaperone present.  Constitutional:      Appearance: She is not ill-appearing.  HENT:     Head:  Normocephalic and atraumatic.  Eyes:     Conjunctiva/sclera: Conjunctivae normal.  Neck:     Musculoskeletal: Neck supple.  Cardiovascular:     Rate and Rhythm: Normal rate and regular rhythm.  Pulmonary:     Effort: Pulmonary effort is normal.     Breath sounds: Normal breath sounds.  Abdominal:     General: Abdomen is flat. Bowel sounds are normal.     Palpations: Abdomen is soft.     Tenderness: There is no abdominal tenderness.  Genitourinary:    Comments: Chaperone present [employer], PA-C. Small amount of thin white discharge in the vaginal vault. Normal appearing cervix. No adnexal or cervical motion tenderness.  Skin:    General: Skin is warm and dry.  Neurological:     Mental Status: She is alert.      ED Treatments / Results  Labs (all labs ordered are listed, but only abnormal results are displayed) Labs Reviewed  WET PREP, GENITAL - Abnormal; Notable for the following components:      Result Value   WBC, Wet Prep HPF POC MANY (*)    All other components within normal limits  COMPREHENSIVE METABOLIC PANEL - Abnormal; Notable for the following components:   Glucose, Bld 113 (*)    All other components within normal limits  CBC - Abnormal; Notable for the following components:   RDW 11.3 (*)    All other components within normal limits  URINALYSIS, ROUTINE W REFLEX MICROSCOPIC - Abnormal; Notable for the following components:   Hgb urine dipstick SMALL (*)    Bacteria, UA FEW (*)    All other components within normal limits  LIPASE, BLOOD  RPR  HIV ANTIBODY (ROUTINE TESTING W REFLEX)  I-STAT BETA HCG BLOOD, ED ([facility], [facility], [facility] ONLY)  GC/CHLAMYDIA PROBE AMP ([place]) NOT AT [facility]    EKG None  Radiology No results found.  Procedures Procedures (including critical care time)  Medications Ordered in ED Medications  sodium chloride flush (NS) 0.9 % injection 3 mL (0 mLs Intravenous Hold 09/06/18 2136)     Initial Impression / Assessment and Plan / ED  Course  I have reviewed the triage vital signs and the nursing notes.  Pertinent labs & imaging results that were available during my care of the patient were reviewed by me and considered in my medical decision making (see chart for details).   PT is a 33 year old female who presents for pelvic pain x 1 year after gonadal venography for pelvic congestion, worsening today. Pt sitting upright in bed during exam; does not appear in severe pain. Denies chance of pregnancy at this time although has  been sexually active. Do not suspect pt has ovarian torsion or TOA; pt afebrile in the ED and appears comfortable during exam. Baseline labs ordered in triage as pt initially reported abdominal pain; do not suspect any elevation in LFTs or lipase at this time. Will do pelvic exam given complaint and obtain wet prep, GC/chlamydia, and RPR/HIV blood tests. U/A ordered as well. Low suspicion for emergent acute process at this time given duration of symptoms.   Pelvic exam without cervicitis or adnexal/cervical motion tenderness. Wet prep and GC/Chlamydia collected. CBC and CMP within normal limits. Awaiting wet prep results prior to discharge although do not suspect any findings given pelvic exam. Discussed with patient she is likely having symptoms related to her pelvic congestion syndrome/recent gonadal emobolization 1 year ago. Advised she should follow up with OBGYN.   Wet prep negative. Pt discharged with follow up with PCP. Pt stable for discharge at this time.        Final Clinical Impressions(s) / ED Diagnoses   Final diagnoses:  Pelvic pain in female  Pelvic congestion syndrome    ED Discharge Orders    None       Tanda RockersVenter, Reyansh Kushnir, PA-C 09/06/18 2353    Loren RacerYelverton, David, MD 09/11/18 [phone]

## 2018-09-06 NOTE — Discharge Instructions (Addendum)
You were seen in the ED today for pelvic pain after having your gonadal emobolization in May 2019. Your workup was negative in the ED. You may receive a call in 2-3 days regarding test results only if they are positive; if you do not receive a call then it was negative. Please follow up with your OBGYN in the morning regarding your continued pain. Return to the ED for worsening pain, vomiting, fever.

## 2018-09-06 NOTE — ED Notes (Signed)
PA a the bedside

## 2018-09-06 NOTE — ED Triage Notes (Signed)
Pt here for generalized abd pain.  Pt stated nausea without emesis or diarrhea.  A&Ox4, ambulatory to triage. Hx of HTN.

## 2018-09-06 NOTE — ED Notes (Signed)
All appropriate discharge materials reviewed with patient at length. Time for questions provided. Pt denies any further questions at this time. Verbalizes understanding of all provided materials.  

## 2018-09-07 LAB — RPR: RPR Ser Ql: NONREACTIVE

## 2018-09-07 LAB — HIV ANTIBODY (ROUTINE TESTING W REFLEX): HIV Screen 4th Generation wRfx: NONREACTIVE

## 2018-09-10 LAB — GC/CHLAMYDIA PROBE AMP (~~LOC~~) NOT AT ARMC
Chlamydia: NEGATIVE
Neisseria Gonorrhea: NEGATIVE

## 2020-01-12 IMAGING — CT CT VENOGRAM ABD-PELV
2 of 12 series · 9 of 46 positions shown, 15 images · IV contrast (OMNIPAQUE)
Comparison: None.

CLINICAL DATA: Labial varicosities, pelvic varicose veins. Dyspnea,
chest tightness, dizziness, weakness, pelvic pressure and pain,
nausea, constipation.

EXAM:
CTA ABDOMEN AND PELVIS WITH CONTRAST
TECHNIQUE: Multidetector CT imaging of the abdomen and pelvis was performed
using the venous phase protocol during bolus administration of
intravenous contrast. Multiplanar reconstructed images and MIPs were
obtained and reviewed to evaluate the vascular anatomy.
CONTRAST:  100mL OMNIPAQUE IOHEXOL 300 MG/ML  SOLN

[Series 3: coronals · coronal · 0.62mm/px · 2 of 136 slices shown, 3 images]
[im 46/136  soft-tissue]
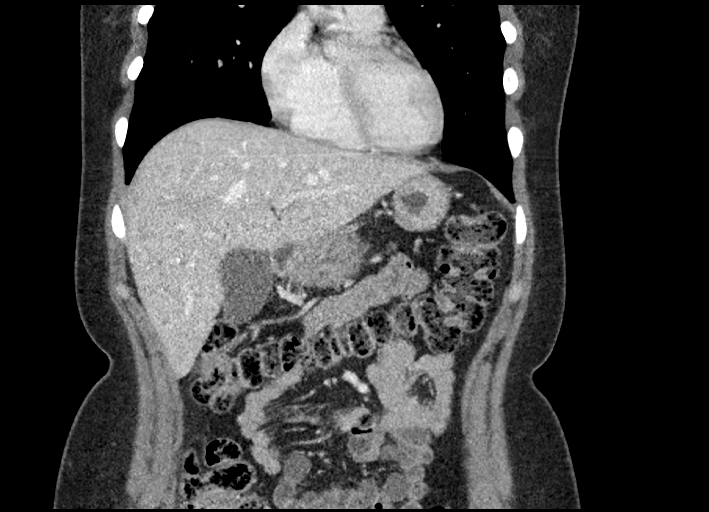
[im 46/136  bone]
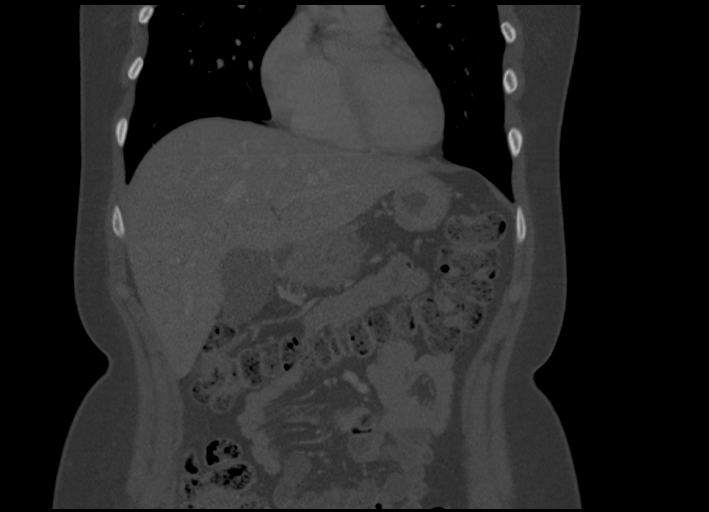
[im 91/136  soft-tissue]
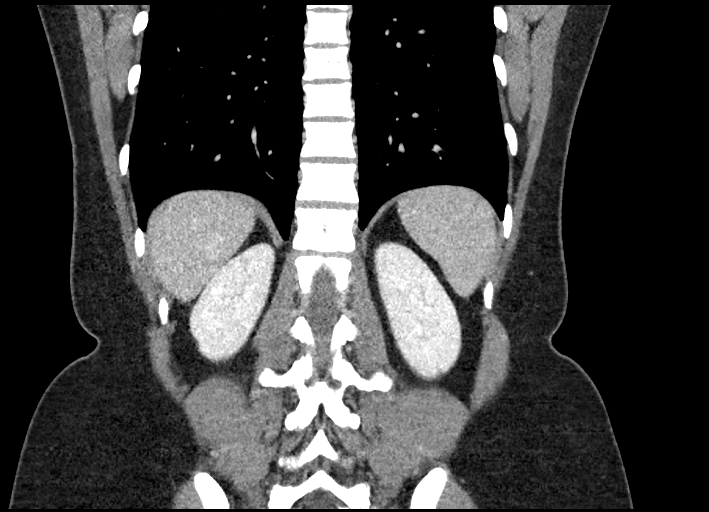

[Series 14: axial venous · axial · portal-venous · 0.80mm/px · z∈[+1018,+1393]mm · 7 of 101 slices shown, 12 images]
[im 13/101  soft-tissue]
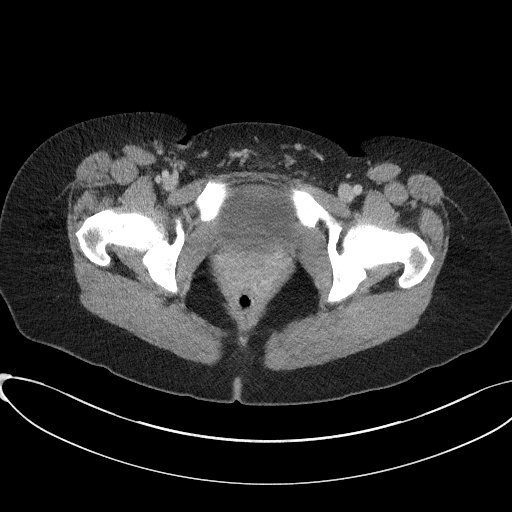
[im 13/101  bone]
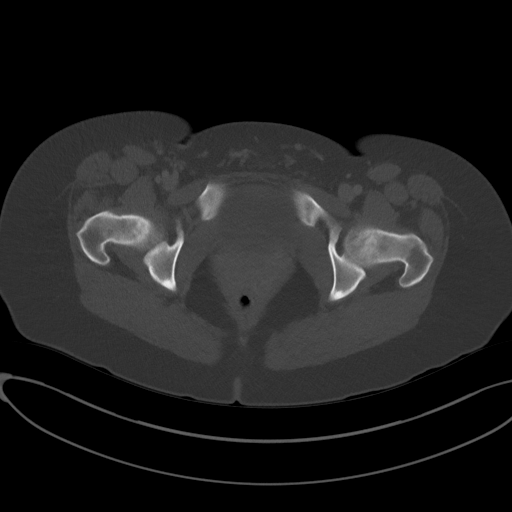
[im 26/101  soft-tissue]
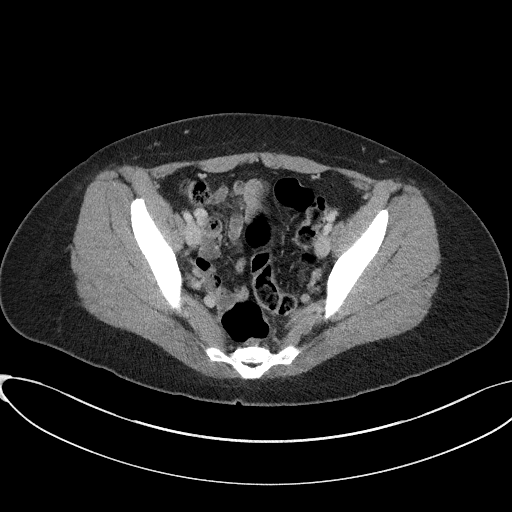
[im 38/101  soft-tissue]
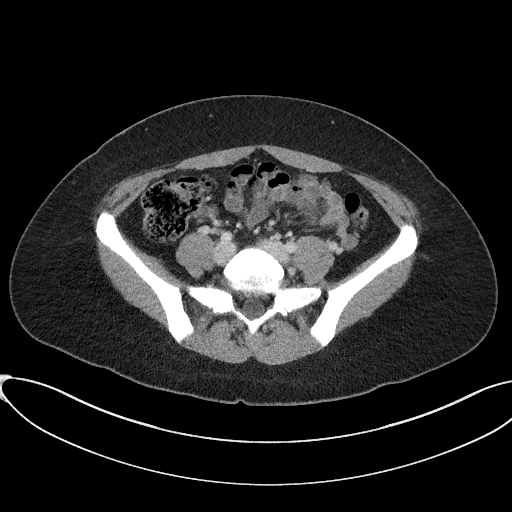
[im 51/101  soft-tissue]
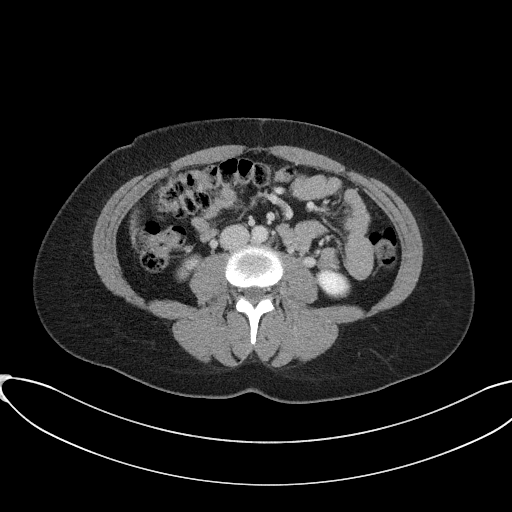
[im 51/101  lung]
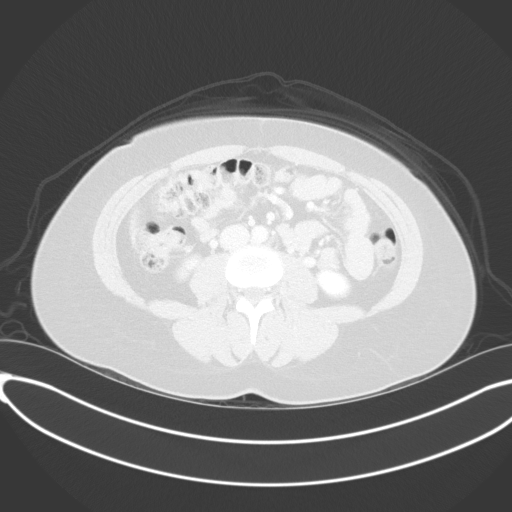
[im 63/101  soft-tissue]
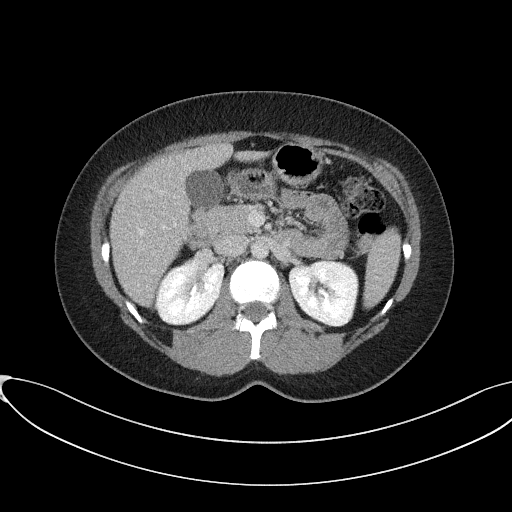
[im 63/101  lung]
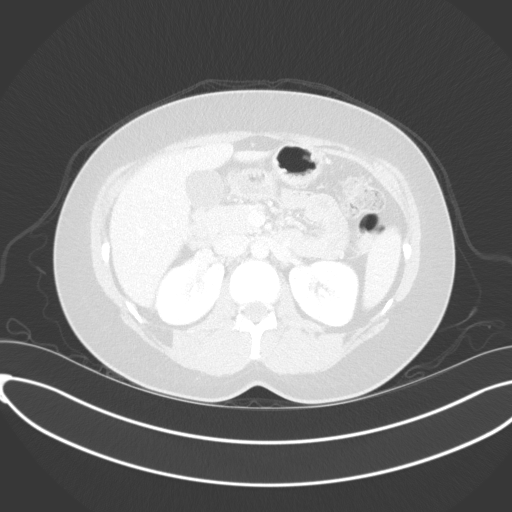
[im 76/101  soft-tissue]
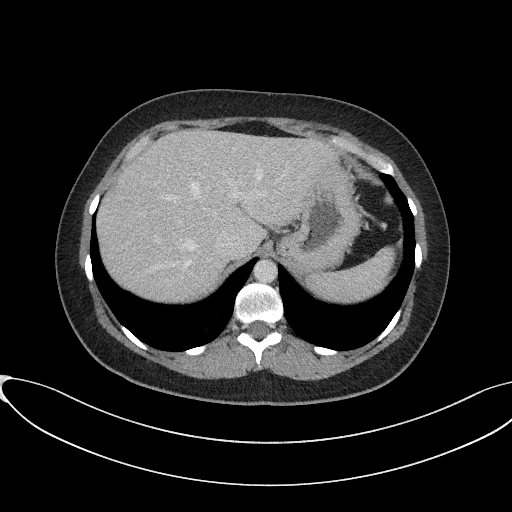
[im 76/101  lung]
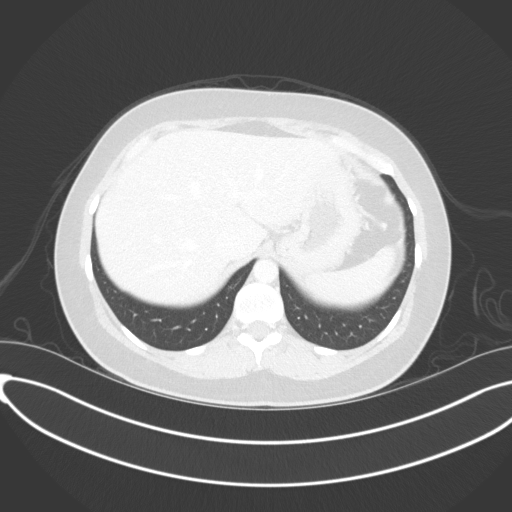
[im 88/101  soft-tissue]
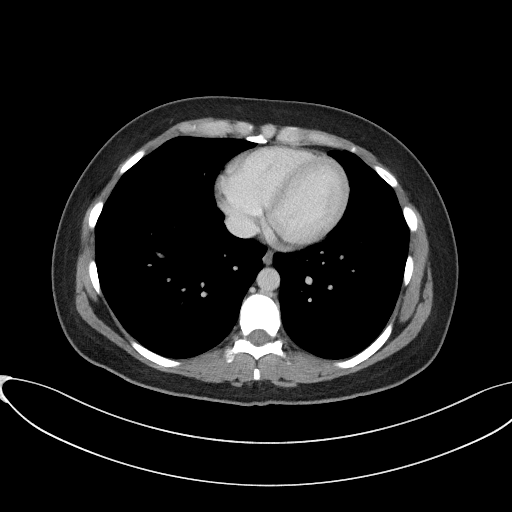
[im 88/101  lung]
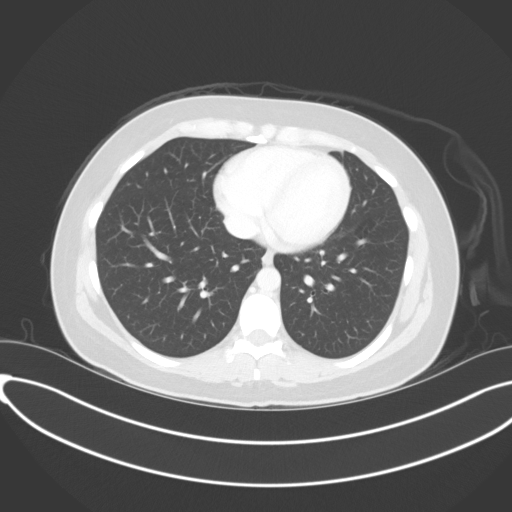

[9 of 46 positions shown; findings below may reference images not displayed]

FINDINGS: VASCULAR

Aorta: Normal caliber aorta without aneurysm, dissection, vasculitis
or significant stenosis.

Celiac: Patent without evidence of aneurysm, dissection, vasculitis
or significant stenosis.

SMA: Patent without evidence of aneurysm, dissection, vasculitis or
significant stenosis.

Renals: Both renal arteries are patent without evidence of aneurysm,
dissection, vasculitis, fibromuscular dysplasia or significant
stenosis.

IMA: Patent without evidence of aneurysm, dissection, vasculitis or
significant stenosis.

Inflow: Patent without evidence of aneurysm, dissection, vasculitis
or significant stenosis.

Proximal Outflow: Bilateral common femoral and visualized portions
of the superficial and profunda femoral arteries are patent without
evidence of aneurysm, dissection, vasculitis or significant
stenosis.

Veins: On portal venous phase imaging, there is abnormal enhancement
of the dilated left gonadal vein consistent with reflux. This
supplies enlarged tortuous venous collateral channels around the
uterus and bilateral ovaries.

The right gonadal vein is prominent and patent without evidence of
reflux.

On the right, there is an enlarged tortuous vein communicating
between the right adnexal plexus and engorged subcutaneous vulvar
veins which extend across the midline.

Small varicose veins are noted in the proximal right thigh. The
visualized proximal great saphenous veins appear normal in caliber.

Patent iliac venous system, IVC, bilateral renal veins, portal vein,
splenic vein, superior and inferior mesenteric veins.

Review of the MIP images confirms the above findings.

NON-VASCULAR

Lower chest: No acute abnormality.

Hepatobiliary: No focal liver abnormality is seen. No gallstones,
gallbladder wall thickening, or biliary dilatation.

Pancreas: Unremarkable. No pancreatic ductal dilatation or
surrounding inflammatory changes.

Spleen: Normal in size without focal abnormality.

Adrenals/Urinary Tract: Adrenal glands are unremarkable. Kidneys are
normal, without renal calculi, focal lesion, or hydronephrosis.
Bladder is unremarkable.

Stomach/Bowel: Stomach is incompletely distended. Small bowel
decompressed. Moderate proximal colonic fecal material, decompressed
distally.

Lymphatic: No abdominal or pelvic adenopathy.

Reproductive: Enlarged venous plexus around the uterus and bilateral
adnexa as above. No adnexal mass.

Other: No ascites.  No free air.

Musculoskeletal: No acute or significant osseous findings.
IMPRESSION: VASCULAR

1. Enlarged refluxing left gonadal vein associated with a large
network of dilated veins around the uterus and bilateral adnexal
regions suggesting pelvic congestion syndrome. Correlate with
symptomatology.
2. The right gonadal vein is enlarged but does not show reflux
during the CT examination.
3. Enlarged communicating vein between the right ovarian venous
plexus and enlarged tortuous subcutaneous veins in the vulvar region
which extend towards the labia and across the midline.

NON-VASCULAR

1. Unremarkable

## 2020-02-19 ENCOUNTER — Ambulatory Visit: Payer: Self-pay | Admitting: Physician Assistant

## 2020-02-19 ENCOUNTER — Other Ambulatory Visit: Payer: Self-pay

## 2020-02-19 VITALS — BP 107/77 | HR 84 | Temp 98.7°F | Resp 18 | Ht 63.0 in | Wt 193.0 lb

## 2020-02-19 DIAGNOSIS — R1011 Right upper quadrant pain: Secondary | ICD-10-CM

## 2020-02-19 DIAGNOSIS — Z13220 Encounter for screening for lipoid disorders: Secondary | ICD-10-CM

## 2020-02-19 DIAGNOSIS — K219 Gastro-esophageal reflux disease without esophagitis: Secondary | ICD-10-CM

## 2020-02-19 DIAGNOSIS — Z6834 Body mass index (BMI) 34.0-34.9, adult: Secondary | ICD-10-CM

## 2020-02-19 DIAGNOSIS — K5909 Other constipation: Secondary | ICD-10-CM

## 2020-02-19 DIAGNOSIS — Z2821 Immunization not carried out because of patient refusal: Secondary | ICD-10-CM

## 2020-02-19 DIAGNOSIS — R1013 Epigastric pain: Secondary | ICD-10-CM

## 2020-02-19 DIAGNOSIS — E6609 Other obesity due to excess calories: Secondary | ICD-10-CM

## 2020-02-19 DIAGNOSIS — N9489 Other specified conditions associated with female genital organs and menstrual cycle: Secondary | ICD-10-CM

## 2020-02-19 NOTE — Progress Notes (Signed)
New Patient Office Visit  Subjective:  Patient ID: [facility], female    DOB: 12-16-1985  Age: 34 y.o. MRN: [id number]  CC:  Chief Complaint  Patient presents with  . Annual Exam    HPI [facility] reports that she has a significant history of pelvic congestion syndrome.  Reports that she had transcatheter embolization in May 2019.  Reports that she continues to have pain.  Reports that she has previously been given over-the-counter and narcotic pain medication without relief, does not want to use any type of pain medication at this time. reports that she did follow-up with vascular and interventional radiology specialist at the end of October 2019.  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  A bedside ultrasound was performed today demonstrating patent and prominent vulvar varicosities in the midline extending into bilateral labial regions.  The prior embolization procedures should have been somewhat effective in treating some of the venous outflow from these varicosities but obviously there remains some inflow into varicosities causing persistent symptoms.  Ultrasound-guided sclerotherapy treatment was offered to the patient to treat symptomatic varicosities.  She is interested in pursuing treatment and treatment will be scheduled for later today.   Electronically SignedIrish Lack T 03/28/2018, 12:56 PM   Reports that she has not been able to follow-up anymore with them due to financial constraints.  Reports that she has an irregular menses cycle, reports that when she does have a menses her pain is worsened.  Reports that she has been having right upper quadrant pain for the last couple of months, describes it as intermittent, pain radiating to her back on the right side.  Will have nausea during the  pain, denies vomiting or diarrhea.  Reports that she also has been experiencing some epigastric discomfort, states that she does experience heartburn and acid reflux on an almost daily basis, states that she takes 40 mg of omeprazole with some relief.  Endorses history of chronic constipation, has failed MiraLAX, does currently take probiotics, last bowel movement was today, normal soft formed.     Past Medical History:  Diagnosis Date  . Preeclampsia   . Varicose veins     Past Surgical History:  Procedure Laterality Date  . IR ANGIOGRAM SELECTIVE EACH ADDITIONAL VESSEL  10/27/2017  . IR ANGIOGRAM SELECTIVE EACH ADDITIONAL VESSEL  10/27/2017  . IR ANGIOGRAM SELECTIVE EACH ADDITIONAL VESSEL  10/27/2017  . IR ANGIOGRAM SELECTIVE EACH ADDITIONAL VESSEL  10/27/2017  . IR EMBO VENOUS NOT HEMORR HEMANG  INC GUIDE ROADMAPPING  10/27/2017  . IR RADIOLOGIST EVAL & MGMT  09/21/2017  . IR RADIOLOGIST EVAL & MGMT  03/28/2018  . IR RADIOLOGIST EVAL & MGMT  07/19/2018  . IR US GUIDE VASC ACCESS RIGHT  10/27/2017  . IR US GUIDE VASC ACCESS RIGHT  10/27/2017  . IR VENOGRAM RENAL UNI RIGHT  10/27/2017    Family History  Problem Relation Age of Onset  . Diabetes Other   . Hypertension Other   . Varicose Veins Mother     Social History   Socioeconomic History  . Marital status: Single    Spouse name: Not on file  . Number of children: Not on file  . Years of education: Not on file  . Highest education level: Not on file  Occupational History  . Not on file  Tobacco Use  . Smoking status:  Never Smoker  . Smokeless tobacco: Never Used  Vaping Use  . Vaping Use: Never used  Substance and Sexual Activity  . Alcohol use: No  . Drug use: No  . Sexual activity: Yes  Other Topics Concern  . Not on file  Social History Narrative  . Not on file   Social Determinants of Health   Financial Resource Strain:   . Difficulty of Paying Living Expenses: Not on file  Food Insecurity:   . Worried  About Programme researcher, broadcasting/film/video in the Last Year: Not on file  . Ran Out of Food in the Last Year: Not on file  Transportation Needs:   . Lack of Transportation (Medical): Not on file  . Lack of Transportation (Non-Medical): Not on file  Physical Activity:   . Days of Exercise per Week: Not on file  . Minutes of Exercise per Session: Not on file  Stress:   . Feeling of Stress : Not on file  Social Connections:   . Frequency of Communication with Friends and Family: Not on file  . Frequency of Social Gatherings with Friends and Family: Not on file  . Attends Religious Services: Not on file  . Active Member of Clubs or Organizations: Not on file  . Attends Banker Meetings: Not on file  . Marital Status: Not on file  Intimate Partner Violence:   . Fear of Current or Ex-Partner: Not on file  . Emotionally Abused: Not on file  . Physically Abused: Not on file  . Sexually Abused: Not on file    ROS Review of Systems  Constitutional: Negative for chills and fever.  HENT: Negative.   Eyes: Negative.   Respiratory: Negative for cough.   Cardiovascular: Negative for chest pain.  Gastrointestinal: Positive for abdominal pain, constipation and nausea. Negative for vomiting.  Endocrine: Negative.   Genitourinary: Positive for dyspareunia, pelvic pain and vaginal pain. Negative for vaginal discharge.  Musculoskeletal: Negative.   Skin: Negative.   Allergic/Immunologic: Negative.   Neurological: Negative.   Hematological: Negative.   Psychiatric/Behavioral: Negative.     Objective:   Today's Vitals: BP 107/77 (BP Location: Left Arm, Patient Position: Sitting, Cuff Size: Normal)   Pulse 84   Temp 98.7 F (37.1 C) (Oral)   Resp 18   Ht 5\' 3"  (1.6 m)   Wt 193 lb (87.5 kg)   SpO2 100%   BMI 34.19 kg/m   Physical Exam Vitals and nursing note reviewed.  Constitutional:      General: She is not in acute distress.    Appearance: Normal appearance. She is not ill-appearing.   HENT:     Head: Normocephalic and atraumatic.     Right Ear: Tympanic membrane, ear canal and external ear normal.     Left Ear: Tympanic membrane, ear canal and external ear normal.     Nose: Nose normal.     Mouth/Throat:     Mouth: Mucous membranes are moist.     Pharynx: Oropharynx is clear.  Eyes:     Extraocular Movements: Extraocular movements intact.     Conjunctiva/sclera: Conjunctivae normal.     Pupils: Pupils are equal, round, and reactive to light.  Cardiovascular:     Rate and Rhythm: Normal rate and regular rhythm.     Pulses: Normal pulses.     Heart sounds: Normal heart sounds.  Pulmonary:     Effort: Pulmonary effort is normal.     Breath sounds: Normal breath sounds.  Abdominal:  General: Abdomen is flat. Bowel sounds are normal.     Palpations: Abdomen is soft.     Tenderness: There is abdominal tenderness in the right upper quadrant, epigastric area and suprapubic area. Negative signs include Murphy's sign.  Musculoskeletal:        General: Normal range of motion.     Cervical back: Normal range of motion and neck supple.  Skin:    General: Skin is warm and dry.  Neurological:     General: No focal deficit present.     Mental Status: She is alert and oriented to person, place, and time.  Psychiatric:        Mood and Affect: Mood normal.        Behavior: Behavior normal.        Thought Content: Thought content normal.        Judgment: Judgment normal.     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 Metabolic Panel (12)    Other Visit Diagnoses    RUQ pain       Relevant Orders   US ABDOMEN LIMITED RUQ   Gastroesophageal reflux disease without esophagitis       Relevant Orders   TSH   Vitamin D, 25-hydroxy   H Pylori, IGM, IGG, IGA AB   Other constipation       Relevant Orders    TSH   Screening for lipid disorders       Relevant Orders   Lipid panel   Abdominal pain, epigastric       Relevant Orders   H Pylori, IGM, IGG, IGA AB   Class 1 obesity due to excess calories without serious comorbidity with body mass index (BMI) of 34.0 to 34.9 in adult          Outpatient Encounter Medications as of 02/19/2020  Medication Sig  . aspirin-acetaminophen-caffeine (EXCEDRIN MIGRAINE) 250-250-65 MG tablet Take by mouth every 6 (six) hours as needed (for cramping).  Marland Kitchen HYDROcodone-acetaminophen (NORCO/VICODIN) 5-325 MG tablet Take 1 tablet by mouth every 4 (four) hours as needed for moderate pain.   . Probiotic Product (ALIGN PO) Take 1 tablet by mouth daily.   No facility-administered encounter medications on file as of 02/19/2020.  1. Female pelvic congestion syndrome Patient is currently uninsured, gave patient education on [place] financial assistance, will have patient establish care at [facility].  Return to [facility] Medicine Unit in one week; patient to have fasting labs completed   - CBC with Differential/Platelet; Future - Comp. Metabolic Panel (12); Future  2. RUQ pain Encourage patient to follow low-fat diet, red flags given for prompt evaluation at the emergency department patient scheduled for right upper quadrant ultrasound  - US ABDOMEN LIMITED RUQ; Future  3. Gastroesophageal reflux disease without esophagitis Continue omeprazole 40 mg on a daily basis, gave patient education on lifestyle modifications - TSH; Future - Vitamin D, 25-hydroxy; Future - H Pylori, IGM, IGG, IGA AB  4. Other constipation Encouraged patient to increase water and fiber intake - TSH; Future  5. Screening for lipid disorders  - Lipid panel; Future  6. Abdominal pain, epigastric  - H Pylori, IGM, IGG, IGA AB  7. Class 1 obesity due to excess calories without serious comorbidity with body mass index (BMI) of 34.0 to  34.9 in adult  8. Covid  vaccine declined Patient education given, patient wants to consider Pfizer, Gala MurdochModerna is available in in our unit today.  I have reviewed the patient's medical history (PMH, PSH, Social History, Family History, Medications, and allergies) , and have been updated if relevant. I spent 45 minutes reviewing chart and  face to face time with patient.    Follow-up: Return in about 1 week (around 02/26/2020).   Kasandra Knudsenari S Mayers, PA-C

## 2020-02-19 NOTE — Patient Instructions (Signed)
Please return to the [facility] Medicine Unit in one week to review labs and ultrasound.  In the meantime, avoid high fat foods, eat a gentle diet, continue to take the omeprazole on a daily basis.  Please have the fasting labs completed as soon as possible.  Roney Jaffe, PA-C Physician Assistant [facility] [email or web address]   Gallbladder Eating Plan If you have a gallbladder condition, you may have trouble digesting fats. Eating a low-fat diet can help reduce your symptoms, and may be helpful before and after having surgery to remove your gallbladder (cholecystectomy). Your health care provider may recommend that you work with a diet and nutrition specialist (dietitian) to help you reduce the amount of fat in your diet. What are tips for following this plan? General guidelines  Limit your fat intake to less than 30% of your total daily calories. If you eat around 1,800 calories each day, this is less than 60 grams (g) of fat per day.  Fat is an important part of a healthy diet. Eating a low-fat diet can make it hard to maintain a healthy body weight. Ask your dietitian how much fat, calories, and other nutrients you need each day.  Eat small, frequent meals throughout the day instead of three large meals.  Drink at least 8-10 cups of fluid a day. Drink enough fluid to keep your urine clear or pale yellow.  Limit alcohol intake to no more than 1 drink a day for nonpregnant women and 2 drinks a day for men. One drink equals 12 oz of beer, 5 oz of wine, or 1 oz of hard liquor. Reading food labels  Check Nutrition Facts on food labels for the amount of fat per serving. Choose foods with less than 3 grams of fat per serving. Shopping  Choose nonfat and low-fat healthy foods. Look for the words "nonfat," "low fat," or "fat free."  Avoid buying processed or prepackaged foods. Cooking  Cook using low-fat methods, such as  baking, broiling, grilling, or boiling.  Cook with small amounts of healthy fats, such as olive oil, grapeseed oil, canola oil, or sunflower oil. What foods are recommended?   All fresh, frozen, or canned fruits and vegetables.  Whole grains.  Low-fat or non-fat (skim) milk and yogurt.  Lean meat, skinless poultry, fish, eggs, and beans.  Low-fat protein supplement powders or drinks.  Spices and herbs. What foods are not recommended?  High-fat foods. These include baked goods, fast food, fatty cuts of meat, ice cream, french toast, sweet rolls, pizza, cheese bread, foods covered with butter, creamy sauces, or cheese.  Fried foods. These include french fries, tempura, battered fish, breaded chicken, fried breads, and sweets.  Foods with strong odors.  Foods that cause bloating and gas. Summary  A low-fat diet can be helpful if you have a gallbladder condition, or before and after gallbladder surgery.  Limit your fat intake to less than 30% of your total daily calories. This is about 60 g of fat if you eat 1,800 calories each day.  Eat small, frequent meals throughout the day instead of three large meals. This information is not intended to replace advice given to you by your health care provider. Make sure you discuss any questions you have with your health care provider. Document Revised: 09/06/2018 Document Reviewed: 06/23/2016 Elsevier Patient Education  2020 [employer].

## 2020-02-19 NOTE — Progress Notes (Signed)
Patient has irregular periods since starting her cycle.  Last menstrual was 1.5 months ago and then 6 months prior to that. Patient denies pain at this time

## 2020-02-20 ENCOUNTER — Other Ambulatory Visit: Payer: Self-pay

## 2020-02-20 DIAGNOSIS — R1011 Right upper quadrant pain: Secondary | ICD-10-CM

## 2020-02-20 DIAGNOSIS — K219 Gastro-esophageal reflux disease without esophagitis: Secondary | ICD-10-CM

## 2020-02-20 DIAGNOSIS — K5909 Other constipation: Secondary | ICD-10-CM

## 2020-02-20 DIAGNOSIS — N9489 Other specified conditions associated with female genital organs and menstrual cycle: Secondary | ICD-10-CM

## 2020-02-20 DIAGNOSIS — Z13220 Encounter for screening for lipoid disorders: Secondary | ICD-10-CM

## 2020-02-20 NOTE — Progress Notes (Signed)
Patient tolerated lab draw well today. Patient had not eaten today.

## 2020-02-21 LAB — CBC WITH DIFFERENTIAL/PLATELET
Basophils Absolute: 0.1 10*3/uL (ref 0.0–0.2)
Basos: 1 %
EOS (ABSOLUTE): 0.3 10*3/uL (ref 0.0–0.4)
Eos: 6 %
Hematocrit: 43.1 % (ref 34.0–46.6)
Hemoglobin: 14 g/dL (ref 11.1–15.9)
Immature Grans (Abs): 0 10*3/uL (ref 0.0–0.1)
Immature Granulocytes: 1 %
Lymphocytes Absolute: 1.9 10*3/uL (ref 0.7–3.1)
Lymphs: 32 %
MCH: 31.4 pg (ref 26.6–33.0)
MCHC: 32.5 g/dL (ref 31.5–35.7)
MCV: 97 fL (ref 79–97)
Monocytes Absolute: 0.4 10*3/uL (ref 0.1–0.9)
Monocytes: 8 %
Neutrophils Absolute: 3.2 10*3/uL (ref 1.4–7.0)
Neutrophils: 52 %
Platelets: 230 10*3/uL (ref 150–450)
RBC: 4.46 x10E6/uL (ref 3.77–5.28)
RDW: 12.1 % (ref 11.7–15.4)
WBC: 5.9 10*3/uL (ref 3.4–10.8)

## 2020-02-21 LAB — COMP. METABOLIC PANEL (12)
AST: 23 IU/L (ref 0–40)
Albumin/Globulin Ratio: 2 (ref 1.2–2.2)
Albumin: 4.7 g/dL (ref 3.8–4.8)
Alkaline Phosphatase: 89 IU/L (ref 44–121)
BUN/Creatinine Ratio: 19 (ref 9–23)
BUN: 14 mg/dL (ref 6–20)
Bilirubin Total: 0.7 mg/dL (ref 0.0–1.2)
Calcium: 9.5 mg/dL (ref 8.7–10.2)
Chloride: 101 mmol/L (ref 96–106)
Creatinine, Ser: 0.74 mg/dL (ref 0.57–1.00)
GFR calc Af Amer: 123 mL/min/{1.73_m2} (ref >59)
GFR calc non Af Amer: 107 mL/min/{1.73_m2} (ref >59)
Globulin, Total: 2.4 g/dL (ref 1.5–4.5)
Glucose: 95 mg/dL (ref 65–99)
Potassium: 4.3 mmol/L (ref 3.5–5.2)
Sodium: 138 mmol/L (ref 134–144)
Total Protein: 7.1 g/dL (ref 6.0–8.5)

## 2020-02-21 LAB — LIPID PANEL
Chol/HDL Ratio: 3.3 ratio (ref 0.0–4.4)
Cholesterol, Total: 187 mg/dL (ref 100–199)
HDL: 56 mg/dL (ref >39)
LDL Chol Calc (NIH): 115 mg/dL — ABNORMAL HIGH (ref 0–99)
Triglycerides: 88 mg/dL (ref 0–149)
VLDL Cholesterol Cal: 16 mg/dL (ref 5–40)

## 2020-02-21 LAB — TSH: TSH: 2.76 u[IU]/mL (ref 0.450–4.500)

## 2020-02-21 LAB — VITAMIN D 25 HYDROXY (VIT D DEFICIENCY, FRACTURES): Vit D, 25-Hydroxy: 25.6 ng/mL — ABNORMAL LOW (ref 30.0–100.0)

## 2020-02-22 ENCOUNTER — Other Ambulatory Visit: Payer: Self-pay

## 2020-02-22 ENCOUNTER — Ambulatory Visit (HOSPITAL_BASED_OUTPATIENT_CLINIC_OR_DEPARTMENT_OTHER)
Admission: RE | Admit: 2020-02-22 | Discharge: 2020-02-22 | Disposition: A | Discharge status: Home / Self Care | Payer: Self-pay | Source: Ambulatory Visit | Attending: Physician Assistant | Admitting: Physician Assistant

## 2020-02-22 ENCOUNTER — Other Ambulatory Visit (HOSPITAL_BASED_OUTPATIENT_CLINIC_OR_DEPARTMENT_OTHER): Payer: Self-pay

## 2020-02-22 DIAGNOSIS — R1011 Right upper quadrant pain: Secondary | ICD-10-CM | POA: Insufficient documentation

## 2020-02-27 ENCOUNTER — Telehealth: Payer: Self-pay

## 2020-02-27 NOTE — Telephone Encounter (Signed)
LVM for patient to contact the office to discuss recent labs with the provider.

## 2020-02-28 NOTE — Progress Notes (Signed)
Patient contacted on 9/29, no answer, LVM for patient to contact the [facility] for visit with the provider to discuss labs & [place] results.

## 2020-06-04 ENCOUNTER — Ambulatory Visit: Payer: Self-pay | Admitting: Internal Medicine

## 2020-06-05 ENCOUNTER — Ambulatory Visit (INDEPENDENT_AMBULATORY_CARE_PROVIDER_SITE_OTHER): Payer: Self-pay | Admitting: Family

## 2020-06-05 NOTE — Progress Notes (Signed)
Patient did not show for appointment.   

## 2022-03-01 ENCOUNTER — Other Ambulatory Visit: Payer: Self-pay | Admitting: Interventional Radiology

## 2022-03-01 DIAGNOSIS — I863 Vulval varices: Secondary | ICD-10-CM

## 2022-03-03 DIAGNOSIS — I83813 Varicose veins of bilateral lower extremities with pain: Secondary | ICD-10-CM | POA: Diagnosis not present

## 2022-03-03 DIAGNOSIS — I872 Venous insufficiency (chronic) (peripheral): Secondary | ICD-10-CM | POA: Diagnosis not present

## 2022-03-03 DIAGNOSIS — R600 Localized edema: Secondary | ICD-10-CM | POA: Diagnosis not present

## 2022-03-07 ENCOUNTER — Ambulatory Visit
Admission: RE | Admit: 2022-03-07 | Discharge: 2022-03-07 | Disposition: A | Discharge status: Home / Self Care | Payer: BC Managed Care – PPO | Source: Ambulatory Visit | Attending: Interventional Radiology | Admitting: Interventional Radiology

## 2022-03-07 ENCOUNTER — Encounter: Payer: Self-pay | Admitting: Lab

## 2022-03-07 DIAGNOSIS — I863 Vulval varices: Secondary | ICD-10-CM

## 2022-03-07 DIAGNOSIS — I83891 Varicose veins of right lower extremities with other complications: Secondary | ICD-10-CM | POA: Diagnosis not present

## 2022-03-07 DIAGNOSIS — R102 Pelvic and perineal pain: Secondary | ICD-10-CM | POA: Diagnosis not present

## 2022-03-07 HISTORY — PX: IR RADIOLOGIST EVAL & MGMT: IMG5224

## 2022-03-07 NOTE — Progress Notes (Signed)
Chief Complaint: Patient was seen in consultation today for   History of Present Illness: Debra Newton is a 36 y.o. 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  She is also status post prior right lower extremity great saphenous vein laser ablation, phlebectomy and sclerotherapy to treat symptomatic superficial venous insufficiency.  She asked to be seen today due to persistent symptoms of pelvic and pubic pain with palpable and visible superficial veins.  She also complains of of worsening right lower extremity varicosities with associated pain and edema of the right lower extremity.  Pelvic pain is worse with prolonged standing as well as with menstrual cycles.  Right lower extremity pain is also worse with prolonged standing.  Appearance of right lower extremity superficial varicosities has progressed gradually since her great saphenous vein ablation in 2019 at [facility].  Debra Newton now has medical insurance through her current employment.  She has an appointment on 04/13/2022 with [facility] to establish gynecologic care.  She currently does not have a primary care physician.  Past Medical History:  Diagnosis Date   Preeclampsia    Varicose veins     Past Surgical History:  Procedure Laterality Date   IR ANGIOGRAM SELECTIVE EACH ADDITIONAL VESSEL  10/27/2017   IR ANGIOGRAM SELECTIVE EACH ADDITIONAL VESSEL  10/27/2017   IR ANGIOGRAM SELECTIVE EACH ADDITIONAL VESSEL   10/27/2017   IR ANGIOGRAM SELECTIVE EACH ADDITIONAL VESSEL  10/27/2017   IR EMBO VENOUS NOT HEMORR HEMANG  INC GUIDE ROADMAPPING  10/27/2017   IR RADIOLOGIST EVAL & MGMT  09/21/2017   IR RADIOLOGIST EVAL & MGMT  03/28/2018   IR RADIOLOGIST EVAL & MGMT  07/19/2018   IR US GUIDE VASC ACCESS RIGHT  10/27/2017   IR US GUIDE VASC ACCESS RIGHT  10/27/2017   IR VENOGRAM RENAL UNI RIGHT  10/27/2017    Allergies: Patient has no known allergies.  Medications: Prior to Admission medications   Medication Sig Start Date End Date Taking? Authorizing Provider  aspirin-acetaminophen-caffeine (EXCEDRIN MIGRAINE) [phone] MG tablet Take by mouth every 6 (six) hours as needed (for cramping).    [provider]  HYDROcodone-acetaminophen (NORCO/VICODIN) 5-325 MG tablet Take 1 tablet by mouth every 4 (four) hours as needed for moderate pain.  10/27/17   [provider]  Probiotic Product (ALIGN PO) Take 1 tablet by mouth daily.    [provider]     Family History  Problem Relation Age of Onset   Diabetes Other    Hypertension Other    Varicose Veins Mother     Social History   Socioeconomic History   Marital status: Married    Spouse name: Not on file   Number of children: Not on file   Years of education: Not on file   Highest education level: Not on file  Occupational History   Not on file  Tobacco Use   Smoking status: Never   Smokeless tobacco: Never  Vaping Use   Vaping Use: Never used  Substance and Sexual Activity   Alcohol use: No   Drug use: No  Sexual activity: Yes  Other Topics Concern   Not on file  Social History Narrative   Not on file   Social Determinants of Health   Financial Resource Strain: Not on file  Food Insecurity: Not on file  Transportation Needs: Not on file  Physical Activity: Not on file  Stress: Not on file  Social Connections: Not on file     Review of Systems: A 12 point ROS discussed and pertinent positives are  indicated in the HPI above.  All other systems are negative.  Review of Systems  Constitutional: Negative.   Respiratory: Negative.    Cardiovascular:  Positive for leg swelling. Negative for chest pain and palpitations.  Gastrointestinal: Negative.   Genitourinary:  Positive for pelvic pain and vaginal pain.  Musculoskeletal: Negative.   Neurological: Negative.     Vital Signs: There were no vitals taken for this visit.    Physical Exam Constitutional:      General: She is not in acute distress.    Appearance: She is not ill-appearing or diaphoretic.  Abdominal:     General: There is no distension.     Palpations: Abdomen is soft.     Tenderness: There is no abdominal tenderness. There is no guarding or rebound.     Hernia: No hernia is present.  Genitourinary:    Comments: Protruding superficial veins in bilateral inguinal, pubic and labia majora regions. Musculoskeletal:     Right lower leg: Edema present.     Comments: Serpiginous palpable superficial varicosities of right lateral thigh. Palpable varicosities of lateral proximal to mid right calf. Mild edema of right lower extremity compared to left. No ulcerations or significant skin abnormalities.  Skin:    General: Skin is warm and dry.  Neurological:     General: No focal deficit present.     Mental Status: She is alert and oriented to person, place, and time.    Imaging: No results found.  Labs:  CBC: No results for input(s): "WBC", "HGB", "HCT", "PLT" in the last 8760 hours.  COAGS: No results for input(s): "INR", "APTT" in the last 8760 hours.  BMP: No results for input(s): "NA", "K", "CL", "CO2", "GLUCOSE", "BUN", "CALCIUM", "CREATININE", "GFRNONAA", "GFRAA" in the last 8760 hours.  Invalid input(s): "CMP"  Assessment and Plan:  #1 Chronic pelvic pain and history of pelvic venous insufficiency: I told Alveena that treating residual symptomatic pelvic venous insufficiency would be very challenging  and potentially difficult given the extensive venous embolization and sclerotherapy she has already had.  A CT venogram of the abdomen and pelvis would be the most useful diagnostic study in determining what veins are currently open and what potential pathways for supply to both the pelvic varicosities and superficial varicosities may be.  She is agreeable to obtaining a CT venogram.  Given persistent symptoms despite prior bilateral gonadal vein trunk embolization and pelvic vein embolization and sclerotherapy, I did introduce the possibility that a hysterectomy might be a possible future option to treat her pelvic symptoms.  #2 Right lower extremity superficial venous insufficiency: There clearly are some prominent superficial varicosities of the right lateral thigh and in the right calf which appear to be progressive since prior vein treatment.  This may reflect recanalization of a segment of the previously treated great saphenous vein or potentially supply from a different pathway such as the short saphenous vein or perforator veins.  She will need a full venous insufficiency evaluation including evaluation of the deep venous system and an  upright superficial venous insufficiency ultrasound exam.  This was not scheduled for today's visit and will be scheduled on a follow-up visit.  Hopefully the CT venogram and right lower extremity venous insufficiency evaluation can be performed on the same date of service in follow-up.   Electronically Signed: Reola Calkins 03/07/2022, 2:23 PM    I spent a total of 25 Minutes in face to face in clinical consultation, greater than 50% of which was counseling/coordinating care for pelvic venous insufficiency and right lower extremity superficial venous insufficiency.

## 2022-03-08 ENCOUNTER — Other Ambulatory Visit: Payer: Self-pay | Admitting: Interventional Radiology

## 2022-03-08 DIAGNOSIS — I872 Venous insufficiency (chronic) (peripheral): Secondary | ICD-10-CM

## 2022-03-08 DIAGNOSIS — N9489 Other specified conditions associated with female genital organs and menstrual cycle: Secondary | ICD-10-CM

## 2022-03-08 DIAGNOSIS — R102 Pelvic and perineal pain: Secondary | ICD-10-CM

## 2022-03-18 ENCOUNTER — Ambulatory Visit
Admission: RE | Admit: 2022-03-18 | Discharge: 2022-03-18 | Disposition: A | Discharge status: Home / Self Care | Payer: BC Managed Care – PPO | Source: Ambulatory Visit | Attending: Interventional Radiology | Admitting: Interventional Radiology

## 2022-03-18 ENCOUNTER — Encounter: Payer: Self-pay | Admitting: *Deleted

## 2022-03-18 ENCOUNTER — Other Ambulatory Visit: Payer: Self-pay

## 2022-03-18 DIAGNOSIS — I872 Venous insufficiency (chronic) (peripheral): Secondary | ICD-10-CM

## 2022-03-18 DIAGNOSIS — I862 Pelvic varices: Secondary | ICD-10-CM | POA: Diagnosis not present

## 2022-03-18 DIAGNOSIS — R102 Pelvic and perineal pain: Secondary | ICD-10-CM

## 2022-03-18 DIAGNOSIS — N9489 Other specified conditions associated with female genital organs and menstrual cycle: Secondary | ICD-10-CM

## 2022-03-18 DIAGNOSIS — I83811 Varicose veins of right lower extremities with pain: Secondary | ICD-10-CM | POA: Diagnosis not present

## 2022-03-18 MED ORDER — IOPAMIDOL (ISOVUE-370) INJECTION 76%
125.0000 mL | Freq: Once | INTRAVENOUS | Status: AC | PRN
  Administered 2022-03-18: 125 mL via INTRAVENOUS

## 2022-03-18 NOTE — Progress Notes (Signed)
Chief Complaint: Patient was seen in consultation today for   History of Present Illness: Debra Newton is a 36 y.o. 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  She is also status post prior right lower extremity great saphenous vein laser ablation, phlebectomy and sclerotherapy to treat symptomatic superficial venous insufficiency.  She was seen on 03/07/2022 due to persistent and worsening symptoms of pelvic and pubic pain as well and is worsening right lower extremity varicosities with associated pain and edema of the right lower extremity.  She returns today for sonographic venous insufficiency evaluation of the right lower extremity as well as CT venography of the abdomen and pelvis.  Past Medical History:  Diagnosis Date   Preeclampsia    Varicose veins     Past Surgical History:  Procedure Laterality Date   IR ANGIOGRAM SELECTIVE EACH ADDITIONAL VESSEL  10/27/2017   IR ANGIOGRAM SELECTIVE EACH ADDITIONAL VESSEL  10/27/2017   IR ANGIOGRAM SELECTIVE EACH ADDITIONAL VESSEL  10/27/2017   IR ANGIOGRAM SELECTIVE EACH ADDITIONAL VESSEL  10/27/2017   IR EMBO VENOUS NOT HEMORR HEMANG  INC GUIDE ROADMAPPING  10/27/2017   IR RADIOLOGIST EVAL & MGMT  09/21/2017   IR RADIOLOGIST EVAL & MGMT  03/28/2018   IR RADIOLOGIST EVAL & MGMT  07/19/2018   IR RADIOLOGIST EVAL & MGMT  03/07/2022   IR US GUIDE VASC ACCESS RIGHT  10/27/2017   IR US GUIDE VASC ACCESS RIGHT  10/27/2017   IR VENOGRAM RENAL  UNI RIGHT  10/27/2017    Allergies: Patient has no known allergies.  Medications: Prior to Admission medications   Medication Sig Start Date End Date Taking? Authorizing Provider  aspirin-acetaminophen-caffeine (EXCEDRIN MIGRAINE) [phone] MG tablet Take by mouth every 6 (six) hours as needed (for cramping).    [provider]  HYDROcodone-acetaminophen (NORCO/VICODIN) 5-325 MG tablet Take 1 tablet by mouth every 4 (four) hours as needed for moderate pain.  10/27/17   [provider]  Probiotic Product (ALIGN PO) Take 1 tablet by mouth daily.    [provider]     Family History  Problem Relation Age of Onset   Diabetes Other    Hypertension Other    Varicose Veins Mother     Social History   Socioeconomic History   Marital status: Married    Spouse name: Not on file   Number of children: Not on file   Years of education: Not on file   Highest education level: Not on file  Occupational History   Not on file  Tobacco Use   Smoking status: Never   Smokeless tobacco: Never  Vaping Use   Vaping Use: Never used  Substance and Sexual Activity   Alcohol use: No   Drug use: No   Sexual activity: Yes  Other Topics Concern   Not on file  Social History Narrative   Not on file   Social Determinants of Health   Financial Resource Strain: Not on file  Food Insecurity: Not on file  Transportation Needs: Not on file  Physical Activity: Not on file  Stress:  Not on file  Social Connections: Not on file      Review of Systems: A 12 point ROS discussed and pertinent positives are indicated in the HPI above.  All other systems are negative.  Review of Systems  Constitutional: Negative.   Respiratory: Negative.    Cardiovascular:  Positive for leg swelling. Negative for chest pain and palpitations.  Gastrointestinal: Negative.   Genitourinary:  Positive for pelvic pain and vaginal pain.  Musculoskeletal: Negative.   Neurological: Negative.      Vital Signs: There were no vitals taken for this visit.  Physical Exam Musculoskeletal:     Comments: Right lower extremity: Serpiginous, palpable varicosities of anterior and anterolateral thigh wrapping around to posterior popliteal region. Palpable varicosities of posterior calf. Mild edema. No ulcers or chronic skin abnormalities.     Imaging: CT VENOGRAM ABDOMEN PELVIS  Result Date: 03/18/2022 CLINICAL DATA:  History of pelvic venous insufficiency with prior transcatheter embolization of pelvic varicosities and bilateral gonadal veins in 2019 as well as ultrasound-guided sclerotherapy of symptomatic vulvar and labial varicosities. The patient is now having recurrent pelvic and pubic pain. EXAM: CT VENOGRAM ABDOMEN AND PELVIS TECHNIQUE: Multi detector CT imaging of the abdomen and pelvis was performed utilizing standard protocol with bolus administration of intravenous contrast during venous and delayed venous phases. Multiplanar CT image reconstructions and MIPs were obtained to evaluate vascular anatomy. RADIATION DOSE REDUCTION: This exam was performed according to the departmental dose-optimization program which includes automated exposure control, adjustment of the mA and/or kV according to patient size and/or use of iterative reconstruction technique. CONTRAST:  ISOVUE-370 IOPAMIDOL (ISOVUE-370) INJECTION 76% COMPARISON:  10/30/2017 FINDINGS: Vascular occluder devices are again noted in occluded bilateral ovarian veins with no evidence of recanalization of the ovarian veins. Multiple embolization coils are also again noted in the right pelvis. No significant recanalized pelvic varicosities are identified. There are some residual mildly prominent and tortuous superficial veins overlying the anterior midline pubic region which are slightly more prominent to the right of midline and appear to communicate with potentially the upper segments of the great saphenous veins bilaterally and  also some deeper branch veins of the anterior pelvis. The IVC, hepatic veins, renal veins, iliac veins and common femoral veins demonstrate normal patency. The portal vein, splenic vein and visualized mesenteric veins are normally patent. Normal appearance of arterial structures the abdomen and pelvis. No evidence of atherosclerosis. Visualized lung bases demonstrate no acute findings. The liver and gallbladder appear normal. The pancreas and spleen are unremarkable. Adrenal glands, kidneys and bladder are within normal limits. Uterus and adnexal regions demonstrate no masses. Bowel shows no evidence of obstruction, ileus, inflammation or lesion. The appendix is normal. No free intraperitoneal air. No lymphadenopathy identified. No hernias were seen. Visualized bony structures are unremarkable. IMPRESSION: 1. No significant recanalized pelvic varicosities are identified after prior ovarian vein occlusion and right pelvic varicosity embolization. There are some residual mildly prominent and tortuous superficial veins overlying the anterior midline pubic region which are slightly more prominent to the right of midline and appear to communicate with potentially the upper segments of the great saphenous veins bilaterally and also some deeper branch veins of the anterior pelvis. 2. Vascular occluder devices are again noted in occluded bilateral ovarian veins without evidence of recanalization. Electronically Signed   By: Irish Lack M.D.   On: 03/18/2022 13:39   US Venous Img Lower Unilateral Right (DVT)  Result Date: 03/18/2022 CLINICAL DATA:  Right lower extremity pain with  symptomatic varicose veins and history of superficial venous insufficiency. Status post prior laser occlusion of the right great saphenous vein in 2019 with sclerotherapy of varicosities. Increased prominence of superficial varicosities since that time along the anterolateral right thigh and also in the calf. EXAM: RIGHT LOWER EXTREMITY  VENOUS DUPLEX ULTRASOUND TECHNIQUE: Gray-scale sonography with graded compression, as well as color Doppler and duplex ultrasound, were performed to evaluate the deep and superficial veins of the right lower extremity. Spectral Doppler was utilized to evaluate flow at rest and with distal augmentation maneuvers. A complete superficial venous insufficiency exam was performed in the upright standing position. I personally performed the technical portion of the exam. COMPARISON:  None Available. FINDINGS: Deep Venous System: Evaluation of the deep venous system including the common femoral, femoral, profunda femoral, popliteal and calf veins (where visible) demonstrate no evidence of deep venous thrombosis. The vessels are compressible and demonstrate normal respiratory phasicity and response to augmentation. No evidence of deep venous reflux. Superficial Venous System: SFJ: Patent with demonstrated reflux with augmentation. GSV Prox Thigh: The proximal GSV is only patent for a short segment after the saphenofemoral junction. There is a large anterolateral branch emanating from the patent upper segment of the GSV that supplies a network of tortuous varicosities along the anterior proximal thigh wrapping around the lateral mid and distal thigh and communicating with varicosities of the posterior knee and calf. The anterolateral branch and varicosities demonstrate incompetence and severe sustained reflux with augmentation. GSV Mid Thigh: Not visualized and likely chronically occluded after prior laser occlusion. GSV Lower Thigh: Not visualized and likely chronically occluded after prior laser occlusion. GSV Prox Calf: Not visualized and likely chronically occluded after prior laser occlusion. GSV Mid Calf: Not visualized and likely chronically occluded after prior laser occlusion. GSV Distal Calf: Not visualized and likely chronically occluded after prior laser occlusion. SPJ: A true saphenopopliteal junction is  demonstrated with the popliteal vein at the level of the knee joint. This junction demonstrates reflux with augmentation. SSV Prox: Dilated measuring 6 mm. Reflux with augmentation. Three K shin with multiple varicosities of the proximal calf and demonstration of some perforator veins to the deep venous system. SSV Mid: Dilated measuring 8 mm. Communication with multiple varicosities of the posterior calf. Sustained reflux with augmentation lasting greater than 4 seconds. SSV Distal: Incompetent vein measuring 4 mm and demonstrating reflux with augmentation. IMPRESSION: 1. No evidence of right lower extremity deep vein thrombus or reflux. 2. Successful occlusion of the right great saphenous vein from the proximal thigh to the distal calf status post previous endovenous laser occlusion. 3. The great saphenous vein is only patent for a short segment after the saphenofemoral junction. There is a large anterolateral branch emanating from the patent upper segment of the great saphenous vein that supplies a network of tortuous varicosities along the anterior proximal thigh wrapping around the lateral mid and distal thigh and communicating with varicosities of the posterior knee and calf. 4. Significant venous insufficiency demonstrated of the right short saphenous vein with true saphenopopliteal junction present and an incompetent short saphenous vein extending into the distal calf which communicates with multiple calf varicosities. These varicosities also communicate with the thigh varicosities that are supplied by the anterolateral branch of the proximal GSV. Electronically Signed   By: Irish Lack M.D.   On: 03/18/2022 13:00   [place] RAD EVAL AND MGMT  Result Date: 03/18/2022 Please refer to "Notes" to see consult details.  IR Radiologist Eval & Mgmt  Result Date: 03/07/2022 Please refer to notes tab for details about interventional procedure. (Op Note)    Assessment and Plan:  Venous insufficiency  ultrasound evaluation of the right lower extremity is dictated separately.  This demonstrates durable occlusion of the right great saphenous vein after prior endovenous laser occlusion.  A short segment of patent GSV in the upper thigh communicates with a prominent insufficient anterolateral branch that supplies a network of anterior and anterolateral thigh varicosities.  This network of varicosities also wraps around to the back of the leg where it anastomosis with an additional network of varicosities that are primarily supplied by an incompetent short saphenous vein.  An enlarged and incompetent short saphenous vein communicates directly with the popliteal vein at the level of the knee and then extends inferiorly down the calf supplying multiple calf varicosities.  The deep venous system is normal.  Based on venous evaluation today CEAP classification for venous insufficiency of the right leg is C3s,Ep,As,Pr.  She has tried both knee-high and thigh-high medical grade compression garments in the past which have not helped with symptom relief.  I did tell Miranda to start using compression garments back when she was seen 11 days ago.  She will continue to use them in order to document a trial of conservative therapy.  Should she fail conservative therapy, she would be a candidate for endovenous laser occlusion of the right short saphenous vein with ultrasound-guided sclerotherapy at that time of anterolateral right thigh varicosities as well as calf varicosities.  A CT venogram of the abdomen and pelvis was also performed today in order to assess patency of previously occluded bilateral gonadal veins and embolized pelvic varicosities.  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  There is no evidence of recanalization of the ovarian veins.  No significant recurrent or recanalized pelvic varicosities are seen after prior  transcatheter coil embolization of predominantly right-sided pelvic varicosities.  There are some prominent serpiginous superficial veins of the anterior pubic region which appear to communicate with proximal great saphenous vein segments and potentially some deeper pelvic branch veins.  There is no indication for transcatheter reintervention at the level of the gonadal veins or internal iliac veins based on the CT venogram.   Electronically Signed: Reola Calkins 03/18/2022, 11:43 AM    I spent a total of  15 Minutes in face to face in clinical consultation, greater than 50% of which was counseling/coordinating care for right lower extremity and pelvic venous insufficiency.

## 2022-03-25 DIAGNOSIS — D2270 Melanocytic nevi of unspecified lower limb, including hip: Secondary | ICD-10-CM | POA: Diagnosis not present

## 2022-03-25 DIAGNOSIS — L918 Other hypertrophic disorders of the skin: Secondary | ICD-10-CM | POA: Diagnosis not present

## 2022-03-25 DIAGNOSIS — B079 Viral wart, unspecified: Secondary | ICD-10-CM | POA: Diagnosis not present

## 2022-04-07 DIAGNOSIS — Z01419 Encounter for gynecological examination (general) (routine) without abnormal findings: Secondary | ICD-10-CM | POA: Diagnosis not present

## 2022-04-07 DIAGNOSIS — Z124 Encounter for screening for malignant neoplasm of cervix: Secondary | ICD-10-CM | POA: Diagnosis not present

## 2022-04-14 ENCOUNTER — Other Ambulatory Visit: Payer: Self-pay | Admitting: Interventional Radiology

## 2022-04-14 ENCOUNTER — Other Ambulatory Visit: Payer: BC Managed Care – PPO

## 2022-04-14 DIAGNOSIS — I872 Venous insufficiency (chronic) (peripheral): Secondary | ICD-10-CM

## 2022-04-29 ENCOUNTER — Telehealth: Payer: BC Managed Care – PPO

## 2022-04-29 ENCOUNTER — Ambulatory Visit
Admission: RE | Admit: 2022-04-29 | Discharge: 2022-04-29 | Disposition: A | Discharge status: Home / Self Care | Payer: BC Managed Care – PPO | Source: Ambulatory Visit | Attending: Interventional Radiology | Admitting: Interventional Radiology

## 2022-04-29 DIAGNOSIS — I872 Venous insufficiency (chronic) (peripheral): Secondary | ICD-10-CM

## 2022-05-02 ENCOUNTER — Encounter: Payer: Self-pay | Admitting: *Deleted

## 2022-05-11 DIAGNOSIS — N83202 Unspecified ovarian cyst, left side: Secondary | ICD-10-CM | POA: Diagnosis not present

## 2022-05-11 DIAGNOSIS — R102 Pelvic and perineal pain: Secondary | ICD-10-CM | POA: Diagnosis not present

## 2022-05-11 DIAGNOSIS — N912 Amenorrhea, unspecified: Secondary | ICD-10-CM | POA: Diagnosis not present

## 2022-05-16 ENCOUNTER — Ambulatory Visit
Admission: RE | Admit: 2022-05-16 | Discharge: 2022-05-16 | Disposition: A | Discharge status: Home / Self Care | Payer: BC Managed Care – PPO | Source: Ambulatory Visit | Attending: Interventional Radiology | Admitting: Interventional Radiology

## 2022-05-16 DIAGNOSIS — I872 Venous insufficiency (chronic) (peripheral): Secondary | ICD-10-CM

## 2022-05-16 HISTORY — PX: IR EMBO VENOUS NOT HEMORR HEMANG  INC GUIDE ROADMAPPING: IMG5447

## 2022-05-16 MED ORDER — DIAZEPAM 5 MG PO TABS
10.0000 mg | ORAL_TABLET | Freq: Once | ORAL | Status: AC
  Administered 2022-05-16: 5 mg via ORAL

## 2022-05-16 MED ORDER — DIAZEPAM 5 MG PO TABS
5.0000 mg | ORAL_TABLET | Freq: Once | ORAL | Status: AC
  Administered 2022-05-16: 5 mg via ORAL

## 2022-05-17 ENCOUNTER — Other Ambulatory Visit: Payer: Self-pay | Admitting: Interventional Radiology

## 2022-05-17 DIAGNOSIS — I872 Venous insufficiency (chronic) (peripheral): Secondary | ICD-10-CM

## 2022-06-03 ENCOUNTER — Other Ambulatory Visit: Payer: BC Managed Care – PPO

## 2022-07-05 DIAGNOSIS — N8302 Follicular cyst of left ovary: Secondary | ICD-10-CM | POA: Diagnosis not present

## 2022-07-05 DIAGNOSIS — N83202 Unspecified ovarian cyst, left side: Secondary | ICD-10-CM | POA: Diagnosis not present

## 2022-07-19 DIAGNOSIS — N925 Other specified irregular menstruation: Secondary | ICD-10-CM | POA: Diagnosis not present

## 2022-07-20 ENCOUNTER — Ambulatory Visit
Admission: RE | Admit: 2022-07-20 | Discharge: 2022-07-20 | Disposition: A | Discharge status: Home / Self Care | Payer: BC Managed Care – PPO | Source: Ambulatory Visit | Attending: Interventional Radiology | Admitting: Interventional Radiology

## 2022-07-20 DIAGNOSIS — I8390 Asymptomatic varicose veins of unspecified lower extremity: Secondary | ICD-10-CM | POA: Diagnosis not present

## 2022-07-20 DIAGNOSIS — I872 Venous insufficiency (chronic) (peripheral): Secondary | ICD-10-CM

## 2022-07-20 DIAGNOSIS — I83811 Varicose veins of right lower extremities with pain: Secondary | ICD-10-CM | POA: Diagnosis not present

## 2022-07-20 DIAGNOSIS — I82811 Embolism and thrombosis of superficial veins of right lower extremities: Secondary | ICD-10-CM | POA: Diagnosis not present

## 2022-07-20 NOTE — Progress Notes (Signed)
Chief Complaint: Patient was seen in consultation today for follow-up after right lower extremity vein therapy on 05/16/2022.  History of Present Illness: Debra Newton is a 37 y.o. female with a complex history of venous insufficiency and status post most recent endovascular laser occlusion of the right short saphenous vein with simultaneous sclerotherapy of right thigh and calf varicosities on 05/16/2022.  She presents for clinical and sonographic follow-up today.  Debra Newton continues to experience chronic pain throughout much of the right lower extremity during the day, especially when she is standing for long periods at work.  She has noticed marginal improvement since her most recent treatment.  She is still wearing thigh-high compression garments on a regular basis.  Past Medical History:  Diagnosis Date   Preeclampsia    Varicose veins     Past Surgical History:  Procedure Laterality Date   IR ANGIOGRAM SELECTIVE EACH ADDITIONAL VESSEL  10/27/2017   IR ANGIOGRAM SELECTIVE EACH ADDITIONAL VESSEL  10/27/2017   IR ANGIOGRAM SELECTIVE EACH ADDITIONAL VESSEL  10/27/2017   IR ANGIOGRAM SELECTIVE EACH ADDITIONAL VESSEL  10/27/2017   IR EMBO VENOUS NOT HEMORR HEMANG  INC GUIDE ROADMAPPING  10/27/2017   IR EMBO VENOUS NOT HEMORR HEMANG  INC GUIDE ROADMAPPING  05/16/2022   IR RADIOLOGIST EVAL & MGMT  09/21/2017   IR RADIOLOGIST EVAL & MGMT  03/28/2018   IR RADIOLOGIST EVAL & MGMT  07/19/2018   IR RADIOLOGIST EVAL & MGMT  03/07/2022   IR US GUIDE VASC ACCESS RIGHT  10/27/2017   IR US GUIDE VASC ACCESS RIGHT  10/27/2017   IR VENOGRAM RENAL UNI RIGHT  10/27/2017    Allergies: Patient has no known allergies.  Medications: Prior to Admission medications   Medication Sig Start Date End Date Taking? Authorizing Provider  aspirin-acetaminophen-caffeine (EXCEDRIN MIGRAINE) [phone] MG tablet Take by mouth every 6 (six) hours as needed (for cramping).    [provider]   HYDROcodone-acetaminophen (NORCO/VICODIN) 5-325 MG tablet Take 1 tablet by mouth every 4 (four) hours as needed for moderate pain.  10/27/17   [provider]  Probiotic Product (ALIGN PO) Take 1 tablet by mouth daily.    [provider]     Family History  Problem Relation Age of Onset   Diabetes Other    Hypertension Other    Varicose Veins Mother     Social History   Socioeconomic History   Marital status: Married    Spouse name: Not on file   Number of children: Not on file   Years of education: Not on file   Highest education level: Not on file  Occupational History   Not on file  Tobacco Use   Smoking status: Never   Smokeless tobacco: Never  Vaping Use   Vaping Use: Never used  Substance and Sexual Activity   Alcohol use: No   Drug use: No   Sexual activity: Yes  Other Topics Concern   Not on file  Social History Narrative   Not on file   Social Determinants of Health   Financial Resource Strain: Not on file  Food Insecurity: Not on file  Transportation Needs: Not on file  Physical Activity: Not on file  Stress: Not on file  Social Connections: Not on file      Review of Systems: A 12 point ROS discussed and pertinent positives are indicated in the HPI above.  All other systems are negative.  Review of Systems  Constitutional: Negative.  Respiratory: Negative.    Cardiovascular:  Positive for leg swelling. Negative for chest pain and palpitations.  Gastrointestinal: Negative.   Genitourinary: Negative.   Musculoskeletal:        Right leg pain.  Neurological: Negative.       Physical Exam Musculoskeletal:     Comments: Right lower extremity: Mild edema. No focal skin abnormalities or ulceration. Mild discoloration along lateral thigh at level of thrombosed varicosities with palpable cords under skin.        Imaging: US Venous Img Lower Unilateral Right (DVT)  Result Date: 07/20/2022 CLINICAL DATA:  Status post endo  venous laser occlusion of the right short saphenous vein with additional sclerotherapy of right calf and thigh varicosities on 05/16/2022. EXAM: RIGHT LOWER EXTREMITY VENOUS DUPLEX ULTRASOUND TECHNIQUE: Gray-scale sonography with graded compression, as well as color Doppler and duplex ultrasound, were performed to evaluate the deep and superficial veins of the lower extremity. Spectral Doppler was utilized to evaluate flow at rest and with distal augmentation maneuvers. I personally performed the technical portion of the exam. COMPARISON:  Imaging on 03/18/2022 as well as during treatment on 05/16/2022. FINDINGS: The deep venous system in the right lower extremity remains normally patent. 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 Varicosities associated with an anterolateral branch of the right thigh demonstrate partial thrombosis after recent sclerotherapy and are largely thrombosed along the lateral aspect of the thigh. The short saphenous vein is partially thrombosed after endo venous laser occlusion in the popliteal fossa. The calf segment demonstrates thickening but is mostly patent with communicating patent varicosities noted in the posterior proximal to mid calf. Several perforator veins are also identified in the calf. IMPRESSION: 1. 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 2. Partial thrombosis of varicosities associated with an anterolateral branch of the right thigh after recent sclerotherapy. 3. Partial occlusion of right short saphenous vein after recent endovenous laser occlusion. 4. The calf segment of the short saphenous vein remains patent with communication to patent calf varicosities and demonstration of patent perforator veins. 5. No evidence of deep venous thrombosis in the right lower extremity. Electronically Signed   By: Aletta Edouard M.D.   On: 07/20/2022 10:54   [place] RAD EVAL AND MGMT  Result Date: 07/20/2022 Please refer to  "Notes" to see consult details.    Assessment and Plan:  Sonographic evaluation today demonstrates thrombosis of multiple right thigh varicosities that are in communication with an anterolateral branch of the right GSV.  There are some open right thigh varicosities more in the anterior thigh.  The short saphenous vein is partially thrombosed after endovenous laser occlusion with occlusion in the popliteal fossa but residual patency in the proximal to mid calf.  There are some communicating open short perforator veins in the calf which are likely contributing to patency.  Communicating varicosities present in the proximal to mid right calf.  I told Debra Newton that the pattern of venous insufficiency in her right lower extremity is quite complex and has been difficult to manage.  There has been some progress after recent treatment but some residual patency of varicosities related to some residual patency of the short saphenous vein, likely on the basis of open perforator veins which has also resulted in some residual patency of thigh and calf varicosities.  I told her that she would likely benefit from some additional sclerotherapy of varicosities and we will check with her insurance for additional sclerotherapy session coverage.  In the meantime, I recommended continued vigilant use of thigh-high compression stockings on the right leg is much as possible.  I gave her a prescription for new compression stockings today rated at 20-30 mmHg compression.  She also requested pantyhose style garments.  We also talked about ways to decrease edema of the right leg today such as increasing her level of exercise, doing isometric exercises while standing or sitting at work, taking breaks to walk at work, losing weight and decreasing sodium intake.  I will see her back for potentially additional varicose vein sclerotherapy of symptomatic right lower extremity varicose veins under ultrasound guidance after we submit for  insurance coverage.  Electronically Signed: Azzie Roup 07/20/2022, 11:59 AM    I spent a total of 15 Minutes in face to face in clinical consultation, greater than 50% of which was counseling/coordinating care for right lower extremity venous insufficiency.

## 2022-07-22 ENCOUNTER — Other Ambulatory Visit: Payer: Self-pay | Admitting: Interventional Radiology

## 2022-07-22 DIAGNOSIS — I872 Venous insufficiency (chronic) (peripheral): Secondary | ICD-10-CM

## 2022-08-25 ENCOUNTER — Ambulatory Visit
Admission: RE | Admit: 2022-08-25 | Discharge: 2022-08-25 | Disposition: A | Discharge status: Home / Self Care | Payer: BC Managed Care – PPO | Source: Ambulatory Visit | Attending: Interventional Radiology | Admitting: Interventional Radiology

## 2022-08-25 DIAGNOSIS — Z9889 Other specified postprocedural states: Secondary | ICD-10-CM | POA: Diagnosis not present

## 2022-08-25 DIAGNOSIS — I82811 Embolism and thrombosis of superficial veins of right lower extremities: Secondary | ICD-10-CM | POA: Diagnosis not present

## 2022-08-25 DIAGNOSIS — I83811 Varicose veins of right lower extremities with pain: Secondary | ICD-10-CM | POA: Diagnosis not present

## 2022-08-25 DIAGNOSIS — I872 Venous insufficiency (chronic) (peripheral): Secondary | ICD-10-CM | POA: Diagnosis not present

## 2022-08-25 NOTE — Progress Notes (Signed)
Chief Complaint: Patient was seen in consultation today for follow-up after right lower extremity vein therapy.   History of Present Illness: Debra Newton is a 37 y.o. female with a complex history of venous insufficiency and status post most recent endovascular laser occlusion of the right short saphenous vein with simultaneous sclerotherapy of right thigh and calf varicosities on 05/16/2022.  She presents for clinical and sonographic follow-up today with possible ultrasound-guided sclerotherapy of persistent varicosities.    Debra Newton continues to experience chronic pain throughout much of the right lower extremity during the day, especially when she is standing for long periods at work.  She experiences pain throughout the right lower extremity, particularly in the knee and calf regions extending to the ankle but also in the anterior and lateral thigh.  She is still wearing thigh-high compression garments on a regular basis.   Past Medical History:  Diagnosis Date   Preeclampsia    Varicose veins     Past Surgical History:  Procedure Laterality Date   IR ANGIOGRAM SELECTIVE EACH ADDITIONAL VESSEL  10/27/2017   IR ANGIOGRAM SELECTIVE EACH ADDITIONAL VESSEL  10/27/2017   IR ANGIOGRAM SELECTIVE EACH ADDITIONAL VESSEL  10/27/2017   IR ANGIOGRAM SELECTIVE EACH ADDITIONAL VESSEL  10/27/2017   IR EMBO VENOUS NOT HEMORR HEMANG  INC GUIDE ROADMAPPING  10/27/2017   IR EMBO VENOUS NOT HEMORR HEMANG  INC GUIDE ROADMAPPING  05/16/2022   IR RADIOLOGIST EVAL & MGMT  09/21/2017   IR RADIOLOGIST EVAL & MGMT  03/28/2018   IR RADIOLOGIST EVAL & MGMT  07/19/2018   IR RADIOLOGIST EVAL & MGMT  03/07/2022   IR US GUIDE VASC ACCESS RIGHT  10/27/2017   IR US GUIDE VASC ACCESS RIGHT  10/27/2017   IR VENOGRAM RENAL UNI RIGHT  10/27/2017    Allergies: Patient has no known allergies.  Medications: Prior to Admission medications   Medication Sig Start Date End Date Taking? Authorizing Provider   aspirin-acetaminophen-caffeine (EXCEDRIN MIGRAINE) [phone] MG tablet Take by mouth every 6 (six) hours as needed (for cramping).    [provider]  HYDROcodone-acetaminophen (NORCO/VICODIN) 5-325 MG tablet Take 1 tablet by mouth every 4 (four) hours as needed for moderate pain.  10/27/17   [provider]  Probiotic Product (ALIGN PO) Take 1 tablet by mouth daily.    [provider]     Family History  Problem Relation Age of Onset   Diabetes Other    Hypertension Other    Varicose Veins Mother     Social History   Socioeconomic History   Marital status: Married    Spouse name: Not on file   Number of children: Not on file   Years of education: Not on file   Highest education level: Not on file  Occupational History   Not on file  Tobacco Use   Smoking status: Never   Smokeless tobacco: Never  Vaping Use   Vaping Use: Never used  Substance and Sexual Activity   Alcohol use: No   Drug use: No   Sexual activity: Yes  Other Topics Concern   Not on file  Social History Narrative   Not on file   Social Determinants of Health   Financial Resource Strain: Not on file  Food Insecurity: Not on file  Transportation Needs: Not on file  Physical Activity: Not on file  Stress: Not on file  Social Connections: Not on file    Review of Systems: A 12 point ROS discussed  and pertinent positives are indicated in the HPI above.  All other systems are negative.  Review of Systems  Cardiovascular:  Positive for leg swelling.  Musculoskeletal:        Right lower extremity pain and edema    Vital Signs: There were no vitals taken for this visit.   Physical Exam Constitutional:      General: She is not in acute distress.    Appearance: She is not ill-appearing, toxic-appearing or diaphoretic.  Musculoskeletal:     Comments: Mild, generalized edema of right lower extremity compared to left. Visible and palpable varicosities along anterior right  thigh and medial right calf.  Neurological:     Mental Status: She is alert.     Assessment and Plan:  Ultrasound today demonstrates persistent varicosities along the anterior and lateral aspect of the right thigh supplied by a proximal anterior branch of the great saphenous vein.  Varicosities of the lateral and posterolateral right knee region communicate with a patent segment of the short saphenous vein.  Varicosities of the medial mid to distal calf communicate with a patent segment of the right great saphenous vein which also communicates with multiple prominent perforator veins.  Due to symptoms of pain and edema of the right lower extremity despite prior treatment and regular use of compression stockings, ultrasound-guided sclerotherapy was performed today in 3 separate areas with foamed sclerosant injected in the proximal thigh, knee region and calf.  There was excellent dispersal of sclerosant throughout varicosities and the procedure was well-tolerated.  A thigh-high compression garment was placed after the procedure.  I will follow-up with Ho[facility] in 1 month.  Electronically Signed: Azzie Roup 08/25/2022, 12:01 PM    I spent a total of 15 Minutes in face to face in clinical consultation, greater than 50% of which was counseling/coordinating care for right lower extremity venous insufficiency.

## 2022-09-14 ENCOUNTER — Ambulatory Visit (INDEPENDENT_AMBULATORY_CARE_PROVIDER_SITE_OTHER): Payer: BC Managed Care – PPO | Admitting: Primary Care

## 2022-09-14 ENCOUNTER — Encounter (INDEPENDENT_AMBULATORY_CARE_PROVIDER_SITE_OTHER): Payer: Self-pay | Admitting: Primary Care

## 2022-09-14 VITALS — BP 107/72 | HR 85 | Resp 16 | Ht 63.0 in | Wt 183.6 lb

## 2022-09-14 DIAGNOSIS — Z131 Encounter for screening for diabetes mellitus: Secondary | ICD-10-CM

## 2022-09-14 DIAGNOSIS — Z1159 Encounter for screening for other viral diseases: Secondary | ICD-10-CM | POA: Diagnosis not present

## 2022-09-14 DIAGNOSIS — E559 Vitamin D deficiency, unspecified: Secondary | ICD-10-CM | POA: Diagnosis not present

## 2022-09-14 DIAGNOSIS — E782 Mixed hyperlipidemia: Secondary | ICD-10-CM | POA: Diagnosis not present

## 2022-09-14 DIAGNOSIS — N920 Excessive and frequent menstruation with regular cycle: Secondary | ICD-10-CM

## 2022-09-14 DIAGNOSIS — R5383 Other fatigue: Secondary | ICD-10-CM

## 2022-09-14 DIAGNOSIS — M2550 Pain in unspecified joint: Secondary | ICD-10-CM

## 2022-09-14 DIAGNOSIS — Z7689 Persons encountering health services in other specified circumstances: Secondary | ICD-10-CM

## 2022-09-14 DIAGNOSIS — E538 Deficiency of other specified B group vitamins: Secondary | ICD-10-CM | POA: Diagnosis not present

## 2022-09-14 NOTE — Progress Notes (Signed)
New Patient Office Visit  Subjective    Patient ID: Debra Newton, female    DOB: Nov 01, 1985  Age: 37 y.o. MRN: [id number]  CC: Establish care  HPI Ms. Debra Newton presents to establish care.  Voices concerns about fatigue and joint pain however when the patient demonstrated whether joint pain was it was actually multiple skeletal.  She also voices concerns about being fatigued and having menorrhagia. Patient has No headache, No chest pain, No abdominal pain - No Nausea, No new weakness tingling or numbness, No Cough - shortness of breath    Outpatient Encounter Medications as of 09/14/2022  Medication Sig   AVIANE 0.1-20 MG-MCG tablet Take 1 tablet by mouth daily.   [DISCONTINUED] aspirin-acetaminophen-caffeine (EXCEDRIN MIGRAINE) 250-250-65 MG tablet Take by mouth every 6 (six) hours as needed (for cramping). (Patient not taking: Reported on 09/14/2022)   [DISCONTINUED] HYDROcodone-acetaminophen (NORCO/VICODIN) 5-325 MG tablet Take 1 tablet by mouth every 4 (four) hours as needed for moderate pain.  (Patient not taking: Reported on 09/14/2022)   [DISCONTINUED] Probiotic Product (ALIGN PO) Take 1 tablet by mouth daily. (Patient not taking: Reported on 09/14/2022)   No facility-administered encounter medications on file as of 09/14/2022.    Past Medical History:  Diagnosis Date   Preeclampsia    Varicose veins     Past Surgical History:  Procedure Laterality Date   IR ANGIOGRAM SELECTIVE EACH ADDITIONAL VESSEL  10/27/2017   IR ANGIOGRAM SELECTIVE EACH ADDITIONAL VESSEL  10/27/2017   IR ANGIOGRAM SELECTIVE EACH ADDITIONAL VESSEL  10/27/2017   IR ANGIOGRAM SELECTIVE EACH ADDITIONAL VESSEL  10/27/2017   IR EMBO VENOUS NOT HEMORR HEMANG  INC GUIDE ROADMAPPING  10/27/2017   IR EMBO VENOUS NOT HEMORR HEMANG  INC GUIDE ROADMAPPING  05/16/2022   IR RADIOLOGIST EVAL & MGMT  09/21/2017   IR RADIOLOGIST EVAL & MGMT  03/28/2018   IR RADIOLOGIST EVAL & MGMT  07/19/2018   IR  RADIOLOGIST EVAL & MGMT  03/07/2022   IR US GUIDE VASC ACCESS RIGHT  10/27/2017   IR US GUIDE VASC ACCESS RIGHT  10/27/2017   IR VENOGRAM RENAL UNI RIGHT  10/27/2017    Family History  Problem Relation Age of Onset   Diabetes Other    Hypertension Other    Varicose Veins Mother     Social History   Socioeconomic History   Marital status: Married    Spouse name: Not on file   Number of children: Not on file   Years of education: Not on file   Highest education level: Not on file  Occupational History   Not on file  Tobacco Use   Smoking status: Never   Smokeless tobacco: Never  Vaping Use   Vaping Use: Never used  Substance and Sexual Activity   Alcohol use: No   Drug use: No   Sexual activity: Yes  Other Topics Concern   Not on file  Social History Narrative   Not on file   Social Determinants of Health   Financial Resource Strain: Not on file  Food Insecurity: Not on file  Transportation Needs: Not on file  Physical Activity: Not on file  Stress: Not on file  Social Connections: Not on file  Intimate Partner Violence: Not on file    ROS Comprehensive ROS Pertinent positive and negative noted in HPI  Objective   Blood Pressure 107/72   Pulse 85   Respiration 16   Height  (1.6 m)   Weight 183  lb 9.6 oz (83.3 kg)   Oxygen Saturation 99%   Body Mass Index 32.52 kg/m   Physical Exam Vitals reviewed.  Constitutional:      Appearance: Normal appearance.  HENT:     Head: Normocephalic.     Right Ear: Tympanic membrane and external ear normal.     Left Ear: Tympanic membrane and external ear normal.     Nose: Nose normal.  Eyes:     Extraocular Movements: Extraocular movements intact.     Pupils: Pupils are equal, round, and reactive to light.  Pulmonary:     Effort: Pulmonary effort is normal.     Breath sounds: Normal breath sounds.  Abdominal:     General: Bowel sounds are normal. There is distension.     Palpations: Abdomen is soft.   Musculoskeletal:        General: Normal range of motion.     Cervical back: Normal range of motion and neck supple.  Skin:    General: Skin is warm and dry.  Neurological:     Mental Status: She is alert and oriented to person, place, and time.  Psychiatric:        Mood and Affect: Mood normal.        Behavior: Behavior normal.     Assessment & Plan:  Diagnoses and all orders for this visit:  Encounter to establish care  Vitamin D deficiency -     VITAMIN D 25 Hydroxy (Vit-D Deficiency, Fractures) -     Comprehensive metabolic panel  Mixed hyperlipidemia History of has not seen a primary care in over 2 years -     Lipid panel  Menorrhagia with regular cycle -     CBC with Differential/Platelet -     Comprehensive metabolic panel  Screening for diabetes mellitus -     Hemoglobin A1c  Vitamin B12 deficiency -     Vitamin B12  Need for hepatitis C screening test -     HCV Ab w Reflex to Quant PCR  Arthralgia, unspecified joint -     Rheumatoid Arthritis Profile  Other fatigue -     TSH + free T4 -     Rheumatoid Arthritis Profile  Other orders -     Interpretation:  Requesting multiple labs to be drawn due to the fact she has not been seen for several years    Follow-up visit will be determined by lab results  Grayce Sessions, NP

## 2022-09-15 LAB — HCV INTERPRETATION

## 2022-09-15 LAB — VITAMIN B12: Vitamin B-12: 478 pg/mL (ref 232–1245)

## 2022-09-15 LAB — HCV AB W REFLEX TO QUANT PCR: HCV Ab: NONREACTIVE

## 2022-09-16 ENCOUNTER — Telehealth: Payer: Self-pay | Admitting: Primary Care

## 2022-09-16 LAB — CBC WITH DIFFERENTIAL/PLATELET
Basophils Absolute: 0 10*3/uL (ref 0.0–0.2)
Basos: 1 %
EOS (ABSOLUTE): 0.2 10*3/uL (ref 0.0–0.4)
Eos: 4 %
Hematocrit: 39.5 % (ref 34.0–46.6)
Hemoglobin: 13.4 g/dL (ref 11.1–15.9)
Immature Grans (Abs): 0 10*3/uL (ref 0.0–0.1)
Immature Granulocytes: 0 %
Lymphocytes Absolute: 2.4 10*3/uL (ref 0.7–3.1)
Lymphs: 39 %
MCH: 31.2 pg (ref 26.6–33.0)
MCHC: 33.9 g/dL (ref 31.5–35.7)
MCV: 92 fL (ref 79–97)
Monocytes Absolute: 0.4 10*3/uL (ref 0.1–0.9)
Monocytes: 6 %
Neutrophils Absolute: 3.1 10*3/uL (ref 1.4–7.0)
Neutrophils: 50 %
Platelets: 246 10*3/uL (ref 150–450)
RBC: 4.29 x10E6/uL (ref 3.77–5.28)
RDW: 12.2 % (ref 11.7–15.4)
WBC: 6.1 10*3/uL (ref 3.4–10.8)

## 2022-09-16 LAB — COMPREHENSIVE METABOLIC PANEL
ALT: 142 IU/L — ABNORMAL HIGH (ref 0–32)
AST: 66 IU/L — ABNORMAL HIGH (ref 0–40)
Albumin/Globulin Ratio: 1.8 (ref 1.2–2.2)
Albumin: 4.6 g/dL (ref 3.9–4.9)
Alkaline Phosphatase: 64 IU/L (ref 44–121)
BUN/Creatinine Ratio: 16 (ref 9–23)
BUN: 16 mg/dL (ref 6–20)
Bilirubin Total: 0.6 mg/dL (ref 0.0–1.2)
CO2: 20 mmol/L (ref 20–29)
Calcium: 9.9 mg/dL (ref 8.7–10.2)
Chloride: 103 mmol/L (ref 96–106)
Creatinine, Ser: 0.99 mg/dL (ref 0.57–1.00)
Globulin, Total: 2.6 g/dL (ref 1.5–4.5)
Glucose: 98 mg/dL (ref 70–99)
Potassium: 4.1 mmol/L (ref 3.5–5.2)
Sodium: 140 mmol/L (ref 134–144)
Total Protein: 7.2 g/dL (ref 6.0–8.5)
eGFR: 76 mL/min/{1.73_m2} (ref >59)

## 2022-09-16 LAB — LIPID PANEL
Chol/HDL Ratio: 4 ratio (ref 0.0–4.4)
Cholesterol, Total: 191 mg/dL (ref 100–199)
HDL: 48 mg/dL (ref >39)
LDL Chol Calc (NIH): 121 mg/dL — ABNORMAL HIGH (ref 0–99)
Triglycerides: 125 mg/dL (ref 0–149)
VLDL Cholesterol Cal: 22 mg/dL (ref 5–40)

## 2022-09-16 LAB — TSH+FREE T4
Free T4: 1.02 ng/dL (ref 0.82–1.77)
TSH: 2.11 u[IU]/mL (ref 0.450–4.500)

## 2022-09-16 LAB — VITAMIN D 25 HYDROXY (VIT D DEFICIENCY, FRACTURES): Vit D, 25-Hydroxy: 36.7 ng/mL (ref 30.0–100.0)

## 2022-09-16 LAB — HEMOGLOBIN A1C
Est. average glucose Bld gHb Est-mCnc: 111 mg/dL
Hgb A1c MFr Bld: 5.5 % (ref 4.8–5.6)

## 2022-09-16 LAB — RHEUMATOID ARTHRITIS PROFILE
Cyclic Citrullin Peptide Ab: 5 units (ref 0–19)
Rheumatoid fact SerPl-aCnc: <10 IU/mL (ref <14.0)

## 2022-09-16 NOTE — Telephone Encounter (Signed)
Copied from CRM [id number]. Topic: General - Inquiry >> Sep 16, 2022  3:38 PM Runell Gess P wrote: Reason for CRM: pt would like someone to call her back about her lab results.  She states she does not have my chart to see them  CB@  [phone]

## 2022-09-18 ENCOUNTER — Encounter (INDEPENDENT_AMBULATORY_CARE_PROVIDER_SITE_OTHER): Payer: Self-pay | Admitting: Primary Care

## 2022-09-19 ENCOUNTER — Telehealth (INDEPENDENT_AMBULATORY_CARE_PROVIDER_SITE_OTHER): Payer: Self-pay | Admitting: Primary Care

## 2022-09-19 NOTE — Telephone Encounter (Signed)
Pt is calling to receive her lab results Please advise CB- [phone]

## 2022-09-19 NOTE — Telephone Encounter (Signed)
Provider has not seen results as of yet once provider has seen results pt will be notified

## 2022-09-19 NOTE — Telephone Encounter (Signed)
Provider has not seen results as of yet once provider has seen results pt will be notified  

## 2024-04-09 ENCOUNTER — Encounter (HOSPITAL_COMMUNITY): Payer: Self-pay | Admitting: Emergency Medicine

## 2024-04-09 ENCOUNTER — Emergency Department (HOSPITAL_COMMUNITY)
Admission: EM | Admit: 2024-04-09 | Discharge: 2024-04-10 | Disposition: A | Discharge status: Home / Self Care | Payer: Self-pay | Attending: Emergency Medicine | Admitting: Emergency Medicine

## 2024-04-09 ENCOUNTER — Other Ambulatory Visit: Payer: Self-pay

## 2024-04-09 DIAGNOSIS — K649 Unspecified hemorrhoids: Secondary | ICD-10-CM

## 2024-04-09 DIAGNOSIS — K5900 Constipation, unspecified: Secondary | ICD-10-CM | POA: Insufficient documentation

## 2024-04-09 DIAGNOSIS — K644 Residual hemorrhoidal skin tags: Secondary | ICD-10-CM | POA: Insufficient documentation

## 2024-04-09 MED ORDER — ACETAMINOPHEN 325 MG PO TABS
650.0000 mg | ORAL_TABLET | Freq: Once | ORAL | Status: AC
  Administered 2024-04-09: 650 mg via ORAL
  Filled 2024-04-09: qty 2

## 2024-04-09 NOTE — ED Triage Notes (Signed)
 Pt arrive POV with family for c/o rectal pain due to hemorrhoids, pt states she is been using home remedies and using suppositories and OTC cream for hemorrhoids with no relief for a week now. Pt reports having some bleeding from the hemorrhoids.

## 2024-04-10 MED ORDER — HYDROCORTISONE 1 % EX CREA: TOPICAL_CREAM | CUTANEOUS | 0 refills | Status: AC

## 2024-04-10 MED ORDER — HYDROCORTISONE ACETATE 25 MG RE SUPP: 25.0000 mg | Freq: Two times a day (BID) | RECTAL | 0 refills | Status: AC

## 2024-04-10 MED ORDER — HYDROCORTISONE ACETATE 25 MG RE SUPP: 25.0000 mg | Freq: Two times a day (BID) | RECTAL | 0 refills | Status: DC

## 2024-04-10 MED ORDER — LIDOCAINE VISCOUS HCL 2 % MT SOLN
15.0000 mL | Freq: Once | OROMUCOSAL | Status: AC
  Administered 2024-04-10: 15 mL via OROMUCOSAL
  Filled 2024-04-10: qty 15

## 2024-04-10 MED ORDER — GLYCERIN (LAXATIVE) 2 G RE SUPP
1.0000 | Freq: Once | RECTAL | Status: AC
  Administered 2024-04-10: 1 via RECTAL
  Filled 2024-04-10: qty 1

## 2024-04-10 MED ORDER — HYDROCORTISONE ACETATE 25 MG RE SUPP
25.0000 mg | Freq: Once | RECTAL | Status: AC
  Administered 2024-04-10: 25 mg via RECTAL
  Filled 2024-04-10: qty 1

## 2024-04-10 NOTE — ED Provider Notes (Signed)
  [place] [facility] AT [facility] Provider Note   CSN: [id number] Arrival date & time: 04/09/24  2039     Patient presents with: Rectal Pain   Debra Newton is a 38 y.o. female.   38 year old female presents with complaint of rectal pain secondary to hemorrhoid.  Patient states that she has had hemorrhoids for years following the birth of her child.  She states she is also always constipated despite trying to increase water and green intake.  States for the past 8 days she has had a painful hemorrhoid that will not go away despite sitz bath's, topical inflammatory cream.       Prior to Admission medications   Medication Sig Start Date End Date Taking? Authorizing Provider  hydrocortisone (ANUSOL-HC) 25 MG suppository Place 1 suppository (25 mg total) rectally 2 (two) times daily. 04/10/24  Yes Beverley Leita LABOR, PA-C  AVIANE 0.1-20 MG-MCG tablet Take 1 tablet by mouth daily. 08/08/22   [provider]    Allergies: Patient has no known allergies.    Review of Systems Negative except as per HPI Updated Vital Signs BP 100/65   Pulse 72   Temp 98.2 F (36.8 C)   Resp 16   SpO2 100%   Physical Exam Vitals and nursing note reviewed. Exam conducted with a chaperone present.  Constitutional:      General: She is not in acute distress.    Appearance: She is well-developed. She is not diaphoretic.  HENT:     Head: Normocephalic and atraumatic.  Pulmonary:     Effort: Pulmonary effort is normal.  Genitourinary:    Rectum: External hemorrhoid present. No anal fissure.  Skin:    General: Skin is warm and dry.     Findings: No erythema or rash.  Neurological:     Mental Status: She is alert and oriented to person, place, and time.  Psychiatric:        Behavior: Behavior normal.     (all labs ordered are listed, but only abnormal results are displayed) Labs Reviewed - No data to display  EKG: None  Radiology: No results  found.   Procedures   Medications Ordered in the ED  Glycerin  (Adult) 2 g suppository 1 suppository (has no administration in time range)  hydrocortisone (ANUSOL-HC) suppository 25 mg (has no administration in time range)  acetaminophen  (TYLENOL ) tablet 650 mg (650 mg Oral Given 04/09/24 2232)                                    Medical Decision Making  38 year old female with rectal pain.  Found to have small hemorrhoid which is tender to the touch.  Recommend MiraLAX and stool softeners to help with her constipation which is likely contributing to her hemorrhoids. Prescription for Anusol sent to pharmacy.  Also recommend Tucks pads.  Referred to general surgery if not improving.     Final diagnoses:  Hemorrhoids, unspecified hemorrhoid type  Constipation, unspecified constipation type    ED Discharge Orders          Ordered    hydrocortisone (ANUSOL-HC) 25 MG suppository  2 times daily        04/10/24 0337               Beverley Leita LABOR, PA-C 04/10/24 0341    Haze Lonni PARAS, MD 04/11/24 [phone]

## 2024-04-10 NOTE — Discharge Instructions (Signed)
 For your constipation:  1 capful of miralax twice daily Colace as directed.  You can increase the miralax until stools are soft and moving without straining.  Anusol suppository of hemorroid as prescribed. You can also use Tucks pads (store these in the refrigerator for more relief). Limit time spent sitting on the commode.   Follow up with surgery as needed.
